# Patient Record
Sex: Female | Born: 1995 | Race: White | Hispanic: No | Marital: Married | Smoking: Current every day smoker
Health system: Southern US, Community
[De-identification: ages and names within clinical notes are randomized; demographics above are authoritative.]

## PROBLEM LIST (undated history)

## (undated) ENCOUNTER — Emergency Department (HOSPITAL_COMMUNITY): Discharge status: Absent without leave | Payer: Self-pay

## (undated) ENCOUNTER — Ambulatory Visit (HOSPITAL_COMMUNITY): Admission: EM | Discharge status: Absent without leave | Payer: No Typology Code available for payment source

## (undated) VITALS — BP 94/64 | HR 86 | Temp 98.6°F | Resp 16 | Ht 67.0 in | Wt 119.0 lb

## (undated) DIAGNOSIS — N390 Urinary tract infection, site not specified: Secondary | ICD-10-CM

## (undated) DIAGNOSIS — J45909 Unspecified asthma, uncomplicated: Secondary | ICD-10-CM

---
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
## 2016-06-30 ENCOUNTER — Emergency Department (HOSPITAL_COMMUNITY): Payer: No Typology Code available for payment source

## 2016-06-30 ENCOUNTER — Encounter (HOSPITAL_COMMUNITY): Payer: Self-pay | Admitting: Emergency Medicine

## 2016-06-30 ENCOUNTER — Ambulatory Visit (HOSPITAL_COMMUNITY)
Admission: EM | Admit: 2016-06-30 | Discharge: 2016-06-30 | Disposition: A | Discharge status: Home / Self Care | Payer: Self-pay | Attending: Family Medicine | Admitting: Family Medicine

## 2016-06-30 ENCOUNTER — Emergency Department (HOSPITAL_COMMUNITY)
Admission: EM | Admit: 2016-06-30 | Discharge: 2016-06-30 | Disposition: A | Discharge status: Home / Self Care | Payer: No Typology Code available for payment source | Attending: Emergency Medicine | Admitting: Emergency Medicine

## 2016-06-30 ENCOUNTER — Ambulatory Visit: Payer: Self-pay

## 2016-06-30 ENCOUNTER — Encounter (HOSPITAL_COMMUNITY): Payer: Self-pay

## 2016-06-30 DIAGNOSIS — Y92410 Unspecified street and highway as the place of occurrence of the external cause: Secondary | ICD-10-CM | POA: Diagnosis not present

## 2016-06-30 DIAGNOSIS — M7918 Myalgia, other site: Secondary | ICD-10-CM

## 2016-06-30 DIAGNOSIS — F17200 Nicotine dependence, unspecified, uncomplicated: Secondary | ICD-10-CM | POA: Insufficient documentation

## 2016-06-30 DIAGNOSIS — S0083XA Contusion of other part of head, initial encounter: Secondary | ICD-10-CM | POA: Insufficient documentation

## 2016-06-30 DIAGNOSIS — M791 Myalgia: Secondary | ICD-10-CM

## 2016-06-30 DIAGNOSIS — Y999 Unspecified external cause status: Secondary | ICD-10-CM | POA: Diagnosis not present

## 2016-06-30 DIAGNOSIS — S161XXA Strain of muscle, fascia and tendon at neck level, initial encounter: Secondary | ICD-10-CM

## 2016-06-30 DIAGNOSIS — S0990XA Unspecified injury of head, initial encounter: Secondary | ICD-10-CM | POA: Diagnosis present

## 2016-06-30 DIAGNOSIS — S060X0A Concussion without loss of consciousness, initial encounter: Secondary | ICD-10-CM

## 2016-06-30 DIAGNOSIS — Y939 Activity, unspecified: Secondary | ICD-10-CM | POA: Diagnosis not present

## 2016-06-30 DIAGNOSIS — M542 Cervicalgia: Secondary | ICD-10-CM | POA: Insufficient documentation

## 2016-06-30 DIAGNOSIS — J45909 Unspecified asthma, uncomplicated: Secondary | ICD-10-CM | POA: Insufficient documentation

## 2016-06-30 HISTORY — DX: Urinary tract infection, site not specified: N39.0

## 2016-06-30 MED ORDER — METHOCARBAMOL 500 MG PO TABS: 500.0000 mg | ORAL_TABLET | Freq: Two times a day (BID) | ORAL | 0 refills | Status: DC

## 2016-06-30 MED ORDER — ACETAMINOPHEN 325 MG PO TABS
650.0000 mg | ORAL_TABLET | Freq: Once | ORAL | Status: AC
  Administered 2016-06-30: 650 mg via ORAL
  Filled 2016-06-30: qty 2
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
## 2016-06-30 NOTE — Discharge Instructions (Signed)
Read the information below.  Your scans were re-assuring. You will probably feel sore for the next 2-3 days. You can take tylenol 650mg  every 6hrs or motrin 400mg  every 6hrs for pain relief.  I have prescribed a muscle relaxer. This can make you drowsy, do not drive after taking.  Use the prescribed medication as directed.  Please discuss all new medications with your pharmacist.   You may return to the Emergency Department at any time for worsening condition or any new symptoms that concern you.
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
## 2016-09-18 ENCOUNTER — Inpatient Hospital Stay (HOSPITAL_COMMUNITY)
Admission: AD | Admit: 2016-09-18 | Discharge: 2016-09-18 | Disposition: A | Discharge status: Home / Self Care | Payer: Self-pay | Source: Ambulatory Visit | Attending: Family Medicine | Admitting: Family Medicine

## 2016-09-18 DIAGNOSIS — N3091 Cystitis, unspecified with hematuria: Secondary | ICD-10-CM | POA: Insufficient documentation

## 2016-09-18 DIAGNOSIS — Z3202 Encounter for pregnancy test, result negative: Secondary | ICD-10-CM | POA: Insufficient documentation

## 2016-09-18 DIAGNOSIS — N3001 Acute cystitis with hematuria: Secondary | ICD-10-CM

## 2016-09-18 DIAGNOSIS — F17200 Nicotine dependence, unspecified, uncomplicated: Secondary | ICD-10-CM | POA: Insufficient documentation

## 2016-09-18 LAB — URINALYSIS, ROUTINE W REFLEX MICROSCOPIC
Bilirubin Urine: NEGATIVE
Glucose, UA: NEGATIVE mg/dL
KETONES UR: NEGATIVE mg/dL
Nitrite: NEGATIVE
PH: 6 (ref 5.0–8.0)
PROTEIN: 30 mg/dL — AB
Specific Gravity, Urine: 1.02 (ref 1.005–1.030)

## 2016-09-18 LAB — POCT PREGNANCY, URINE: Preg Test, Ur: NEGATIVE

## 2016-09-18 MED ORDER — SULFAMETHOXAZOLE-TRIMETHOPRIM 800-160 MG PO TABS: 1.0000 | ORAL_TABLET | Freq: Two times a day (BID) | ORAL | 1 refills | Status: DC

## 2016-09-18 NOTE — Discharge Instructions (Signed)
Urinary Tract Infection, Adult Introduction A urinary tract infection (UTI) is an infection of any part of the urinary tract. The urinary tract includes the:  Kidneys.  Ureters.  Bladder.  Urethra. These organs make, store, and get rid of pee (urine) in the body. Follow these instructions at home:  Take over-the-counter and prescription medicines only as told by your doctor.  If you were prescribed an antibiotic medicine, take it as told by your doctor. Do not stop taking the antibiotic even if you start to feel better.  Avoid the following drinks:  Alcohol.  Caffeine.  Tea.  Carbonated drinks.  Drink enough fluid to keep your pee clear or pale yellow.  Keep all follow-up visits as told by your doctor. This is important.  Make sure to:  Empty your bladder often and completely. Do not to hold pee for long periods of time.  Empty your bladder before and after sex.  Wipe from front to back after a bowel movement if you are female. Use each tissue one time when you wipe. Contact a doctor if:  You have back pain.  You have a fever.  You feel sick to your stomach (nauseous).  You throw up (vomit).  Your symptoms do not get better after 3 days.  Your symptoms go away and then come back. Get help right away if:  You have very bad back pain.  You have very bad lower belly (abdominal) pain.  You are throwing up and cannot keep down any medicines or water. This information is not intended to replace advice given to you by your health care provider. Make sure you discuss any questions you have with your health care provider. Document Released: 03/16/2008 Document Revised: 03/05/2016 Document Reviewed: 08/19/2015  2017 Elsevier  

## 2016-09-18 NOTE — MAU Note (Signed)
Patient has blood in urine, frequency and dysuria started 2 night ago.

## 2016-09-18 NOTE — MAU Provider Note (Signed)
Chief Complaint:  Dysuria   First Provider Initiated Contact with Patient 09/18/16 1528       HPI: Vanessa MortonCarolyn Henry is a 20 y.o. No obstetric history on file. who presents to maternity admissions reporting blood in urine. Has some frequency and dysuria.. She reports no vaginal bleeding, vaginal itching/burning, urinary symptoms, h/a, dizziness, n/v, or fever/chills.    Dysuria   This is a new problem. The current episode started in the past 7 days. The problem occurs every urination. The problem has been unchanged. The quality of the pain is described as burning. The pain is mild. There has been no fever. There is a history of pyelonephritis. Associated symptoms include hematuria. Pertinent negatives include no chills, flank pain, nausea, urgency or vomiting. She has tried nothing for the symptoms.   RN Note: Patient has blood in urine, frequency and dysuria started 2 night ago.    Past Medical History: Past Medical History:  Diagnosis Date  . Asthma   . UTI (lower urinary tract infection)     Past obstetric history: OB History  No data available    Past Surgical History: No past surgical history on file.  Family History: No family history on file.  Social History: Social History  Substance Use Topics  . Smoking status: Current Every Day Smoker  . Smokeless tobacco: Never Used  . Alcohol use Yes    Allergies:  Allergies  Allergen Reactions  . Red Dye Hives    Meds:  Prescriptions Prior to Admission  Medication Sig Dispense Refill Last Dose  . ibuprofen (ADVIL,MOTRIN) 200 MG tablet Take 400 mg by mouth every 6 (six) hours as needed (for pain).   Past Week at Unknown time  . methocarbamol (ROBAXIN) 500 MG tablet Take 1 tablet (500 mg total) by mouth 2 (two) times daily. 10 tablet 0   . nitrofurantoin, macrocrystal-monohydrate, (MACROBID) 100 MG capsule Take 1 capsule (100 mg total) by mouth 2 (two) times daily. (Patient not taking: Reported on 06/30/2016) 14 capsule 0  Not Taking at Unknown time    I have reviewed patient's Past Medical Hx, Surgical Hx, Family Hx, Social Hx, medications and allergies.  ROS:  Review of Systems  Constitutional: Negative for chills.  Gastrointestinal: Negative for nausea and vomiting.  Genitourinary: Positive for dysuria and hematuria. Negative for flank pain and urgency.   Other systems negative     Physical Exam  Patient Vitals for the past 24 hrs:  BP Temp Pulse Resp Height Weight  09/18/16 1437 125/65 98.9 F (37.2 C) 75 18 5\' 7"  (1.702 m) 115 lb (52.2 kg)   Constitutional: Well-developed, well-nourished female in no acute distress.  Cardiovascular: normal rate and rhythm, no ectopy audible, S1 & S2 heard, no murmur Respiratory: normal effort, no distress. Lungs CTAB with no wheezes or crackles GI: Abd soft, non-tender.  Nondistended.  No rebound, No guarding.  Bowel Sounds audible  MS: Extremities nontender, no edema, normal ROM Neurologic: Alert and oriented x 4.   Grossly nonfocal. GU: Neg CVAT. Skin:  Warm and Dry Psych:  Affect appropriate.  PELVIC EXAM: deferred   Labs: Results for orders placed or performed during the hospital encounter of 09/18/16 (from the past 24 hour(s))  Urinalysis, Routine w reflex microscopic     Status: Abnormal   Collection Time: 09/18/16  2:39 PM  Result Value Ref Range   Color, Urine YELLOW YELLOW   APPearance HAZY (A) CLEAR   Specific Gravity, Urine 1.020 1.005 - 1.030   pH  6.0 5.0 - 8.0   Glucose, UA NEGATIVE NEGATIVE mg/dL   Hgb urine dipstick SMALL (A) NEGATIVE   Bilirubin Urine NEGATIVE NEGATIVE   Ketones, ur NEGATIVE NEGATIVE mg/dL   Protein, ur 30 (A) NEGATIVE mg/dL   Nitrite NEGATIVE NEGATIVE   Leukocytes, UA MODERATE (A) NEGATIVE   RBC / HPF TOO NUMEROUS TO COUNT 0 - 5 RBC/hpf   WBC, UA TOO NUMEROUS TO COUNT 0 - 5 WBC/hpf   Bacteria, UA RARE (A) NONE SEEN   Squamous Epithelial / LPF 0-5 (A) NONE SEEN   Mucous PRESENT   Pregnancy, urine POC      Status: None   Collection Time: 09/18/16  2:47 PM  Result Value Ref Range   Preg Test, Ur NEGATIVE NEGATIVE      Imaging:  No results found.  MAU Course/MDM: I have ordered labs as follows: see above, added culture Imaging ordered: none Results reviewed.   Pt stable at time of discharge.  Assessment: Cystitis with hematuria  Plan: Discharge home Recommend Push fluids Rx sent for Bactrim Ds for UTI   Encouraged to return here or to other Urgent Care/ED if she develops worsening of symptoms, increase in pain, fever, or other concerning symptoms.   Wynelle BourgeoisMarie Deangela Randleman CNM, MSN Certified Nurse-Midwife 09/18/2016 3:28 PM

## 2016-09-20 LAB — URINE CULTURE: Culture: >=100000 — AB

## 2018-07-18 DIAGNOSIS — N760 Acute vaginitis: Secondary | ICD-10-CM | POA: Diagnosis not present

## 2018-07-18 DIAGNOSIS — R3915 Urgency of urination: Secondary | ICD-10-CM | POA: Diagnosis not present

## 2018-12-20 DIAGNOSIS — F411 Generalized anxiety disorder: Secondary | ICD-10-CM | POA: Diagnosis not present

## 2018-12-20 DIAGNOSIS — F331 Major depressive disorder, recurrent, moderate: Secondary | ICD-10-CM | POA: Diagnosis not present

## 2018-12-22 DIAGNOSIS — J069 Acute upper respiratory infection, unspecified: Secondary | ICD-10-CM | POA: Diagnosis not present

## 2018-12-22 DIAGNOSIS — J309 Allergic rhinitis, unspecified: Secondary | ICD-10-CM | POA: Diagnosis not present

## 2018-12-23 DIAGNOSIS — F411 Generalized anxiety disorder: Secondary | ICD-10-CM | POA: Diagnosis not present

## 2018-12-23 DIAGNOSIS — F331 Major depressive disorder, recurrent, moderate: Secondary | ICD-10-CM | POA: Diagnosis not present

## 2019-01-17 DIAGNOSIS — F331 Major depressive disorder, recurrent, moderate: Secondary | ICD-10-CM | POA: Diagnosis not present

## 2019-01-20 DIAGNOSIS — F331 Major depressive disorder, recurrent, moderate: Secondary | ICD-10-CM | POA: Diagnosis not present

## 2019-02-21 DIAGNOSIS — F331 Major depressive disorder, recurrent, moderate: Secondary | ICD-10-CM | POA: Diagnosis not present

## 2019-02-21 DIAGNOSIS — F411 Generalized anxiety disorder: Secondary | ICD-10-CM | POA: Diagnosis not present

## 2019-05-17 DIAGNOSIS — R5383 Other fatigue: Secondary | ICD-10-CM | POA: Diagnosis not present

## 2019-05-17 DIAGNOSIS — Z7189 Other specified counseling: Secondary | ICD-10-CM | POA: Diagnosis not present

## 2019-05-17 DIAGNOSIS — K219 Gastro-esophageal reflux disease without esophagitis: Secondary | ICD-10-CM | POA: Diagnosis not present

## 2019-06-05 DIAGNOSIS — Z20828 Contact with and (suspected) exposure to other viral communicable diseases: Secondary | ICD-10-CM | POA: Diagnosis not present

## 2019-07-06 ENCOUNTER — Other Ambulatory Visit: Payer: Self-pay

## 2019-07-06 DIAGNOSIS — Z20822 Contact with and (suspected) exposure to covid-19: Secondary | ICD-10-CM

## 2019-07-07 LAB — NOVEL CORONAVIRUS, NAA: SARS-CoV-2, NAA: NOT DETECTED

## 2019-08-02 ENCOUNTER — Other Ambulatory Visit: Payer: Self-pay

## 2019-08-02 DIAGNOSIS — Z20822 Contact with and (suspected) exposure to covid-19: Secondary | ICD-10-CM

## 2019-08-03 LAB — NOVEL CORONAVIRUS, NAA: SARS-CoV-2, NAA: NOT DETECTED

## 2019-08-07 ENCOUNTER — Other Ambulatory Visit: Payer: Self-pay | Admitting: Registered"

## 2019-08-07 DIAGNOSIS — Z20822 Contact with and (suspected) exposure to covid-19: Secondary | ICD-10-CM

## 2019-08-08 LAB — NOVEL CORONAVIRUS, NAA: SARS-CoV-2, NAA: NOT DETECTED

## 2019-08-21 ENCOUNTER — Other Ambulatory Visit: Payer: Self-pay

## 2019-08-21 DIAGNOSIS — Z20822 Contact with and (suspected) exposure to covid-19: Secondary | ICD-10-CM

## 2019-08-22 LAB — NOVEL CORONAVIRUS, NAA: SARS-CoV-2, NAA: NOT DETECTED

## 2019-09-06 ENCOUNTER — Other Ambulatory Visit: Payer: Self-pay

## 2019-09-06 DIAGNOSIS — Z20822 Contact with and (suspected) exposure to covid-19: Secondary | ICD-10-CM

## 2019-09-08 LAB — NOVEL CORONAVIRUS, NAA: SARS-CoV-2, NAA: NOT DETECTED

## 2019-09-12 ENCOUNTER — Other Ambulatory Visit: Payer: Self-pay

## 2019-09-12 DIAGNOSIS — Z20822 Contact with and (suspected) exposure to covid-19: Secondary | ICD-10-CM

## 2019-09-14 LAB — NOVEL CORONAVIRUS, NAA: SARS-CoV-2, NAA: NOT DETECTED

## 2019-09-29 ENCOUNTER — Other Ambulatory Visit: Payer: Self-pay | Admitting: Cardiology

## 2019-09-29 DIAGNOSIS — Z20822 Contact with and (suspected) exposure to covid-19: Secondary | ICD-10-CM

## 2019-10-01 LAB — NOVEL CORONAVIRUS, NAA: SARS-CoV-2, NAA: NOT DETECTED

## 2019-10-20 ENCOUNTER — Ambulatory Visit: Payer: Medicaid Other | Attending: Internal Medicine

## 2019-10-20 DIAGNOSIS — Z20822 Contact with and (suspected) exposure to covid-19: Secondary | ICD-10-CM

## 2019-10-22 LAB — NOVEL CORONAVIRUS, NAA: SARS-CoV-2, NAA: NOT DETECTED

## 2019-10-31 ENCOUNTER — Ambulatory Visit: Payer: Medicaid Other | Attending: Internal Medicine

## 2019-10-31 DIAGNOSIS — Z20822 Contact with and (suspected) exposure to covid-19: Secondary | ICD-10-CM

## 2019-11-01 LAB — NOVEL CORONAVIRUS, NAA: SARS-CoV-2, NAA: NOT DETECTED

## 2019-11-14 ENCOUNTER — Ambulatory Visit: Payer: Medicaid Other | Attending: Internal Medicine

## 2019-11-14 DIAGNOSIS — Z20822 Contact with and (suspected) exposure to covid-19: Secondary | ICD-10-CM

## 2019-11-15 LAB — NOVEL CORONAVIRUS, NAA: SARS-CoV-2, NAA: NOT DETECTED

## 2019-11-29 ENCOUNTER — Ambulatory Visit: Payer: Medicaid Other | Attending: Internal Medicine

## 2019-11-29 DIAGNOSIS — Z20822 Contact with and (suspected) exposure to covid-19: Secondary | ICD-10-CM

## 2019-11-30 LAB — NOVEL CORONAVIRUS, NAA: SARS-CoV-2, NAA: NOT DETECTED

## 2019-12-13 ENCOUNTER — Ambulatory Visit: Payer: Medicaid Other | Attending: Internal Medicine

## 2019-12-13 DIAGNOSIS — Z20822 Contact with and (suspected) exposure to covid-19: Secondary | ICD-10-CM

## 2019-12-14 LAB — NOVEL CORONAVIRUS, NAA: SARS-CoV-2, NAA: NOT DETECTED

## 2019-12-20 ENCOUNTER — Ambulatory Visit: Payer: Medicaid Other | Attending: Internal Medicine

## 2019-12-20 DIAGNOSIS — Z20822 Contact with and (suspected) exposure to covid-19: Secondary | ICD-10-CM

## 2019-12-21 LAB — NOVEL CORONAVIRUS, NAA: SARS-CoV-2, NAA: NOT DETECTED

## 2020-01-17 ENCOUNTER — Ambulatory Visit: Payer: Medicaid Other | Attending: Internal Medicine

## 2020-01-17 DIAGNOSIS — Z20822 Contact with and (suspected) exposure to covid-19: Secondary | ICD-10-CM

## 2020-01-18 LAB — NOVEL CORONAVIRUS, NAA: SARS-CoV-2, NAA: NOT DETECTED

## 2020-01-18 LAB — SARS-COV-2, NAA 2 DAY TAT

## 2020-03-06 ENCOUNTER — Ambulatory Visit: Payer: Medicaid Other | Attending: Internal Medicine

## 2020-03-06 DIAGNOSIS — Z20822 Contact with and (suspected) exposure to covid-19: Secondary | ICD-10-CM

## 2020-03-07 LAB — NOVEL CORONAVIRUS, NAA: SARS-CoV-2, NAA: NOT DETECTED

## 2020-03-07 LAB — SARS-COV-2, NAA 2 DAY TAT

## 2020-04-01 ENCOUNTER — Ambulatory Visit: Payer: Medicaid Other | Attending: Internal Medicine

## 2020-04-01 DIAGNOSIS — Z20822 Contact with and (suspected) exposure to covid-19: Secondary | ICD-10-CM

## 2020-04-02 LAB — NOVEL CORONAVIRUS, NAA: SARS-CoV-2, NAA: NOT DETECTED

## 2020-04-02 LAB — SARS-COV-2, NAA 2 DAY TAT

## 2020-05-01 ENCOUNTER — Other Ambulatory Visit: Payer: Self-pay | Admitting: *Deleted

## 2020-05-01 ENCOUNTER — Ambulatory Visit: Payer: Medicaid Other | Attending: Internal Medicine

## 2020-05-01 DIAGNOSIS — Z20822 Contact with and (suspected) exposure to covid-19: Secondary | ICD-10-CM

## 2020-05-02 LAB — NOVEL CORONAVIRUS, NAA: SARS-CoV-2, NAA: NOT DETECTED

## 2020-05-02 LAB — SARS-COV-2, NAA 2 DAY TAT

## 2020-05-14 ENCOUNTER — Other Ambulatory Visit: Payer: Medicaid Other

## 2020-05-14 ENCOUNTER — Other Ambulatory Visit: Payer: Self-pay

## 2020-05-14 DIAGNOSIS — Z20822 Contact with and (suspected) exposure to covid-19: Secondary | ICD-10-CM

## 2020-05-15 LAB — NOVEL CORONAVIRUS, NAA: SARS-CoV-2, NAA: NOT DETECTED

## 2020-05-15 LAB — SARS-COV-2, NAA 2 DAY TAT

## 2020-06-13 ENCOUNTER — Other Ambulatory Visit: Payer: Self-pay

## 2020-06-13 ENCOUNTER — Other Ambulatory Visit: Payer: Self-pay | Admitting: Behavioral Health

## 2020-06-13 ENCOUNTER — Inpatient Hospital Stay (HOSPITAL_COMMUNITY)
Admission: RE | Admit: 2020-06-13 | Discharge: 2020-06-16 | DRG: 881 | Disposition: A | Discharge status: Home / Self Care | Payer: Federal, State, Local not specified - Other | Attending: Psychiatry | Admitting: Psychiatry

## 2020-06-13 ENCOUNTER — Encounter (HOSPITAL_COMMUNITY): Payer: Self-pay | Admitting: Behavioral Health

## 2020-06-13 DIAGNOSIS — Z79899 Other long term (current) drug therapy: Secondary | ICD-10-CM

## 2020-06-13 DIAGNOSIS — F17200 Nicotine dependence, unspecified, uncomplicated: Secondary | ICD-10-CM | POA: Diagnosis present

## 2020-06-13 DIAGNOSIS — E739 Lactose intolerance, unspecified: Secondary | ICD-10-CM | POA: Diagnosis present

## 2020-06-13 DIAGNOSIS — Z9109 Other allergy status, other than to drugs and biological substances: Secondary | ICD-10-CM

## 2020-06-13 DIAGNOSIS — R45851 Suicidal ideations: Secondary | ICD-10-CM | POA: Diagnosis present

## 2020-06-13 DIAGNOSIS — Z20822 Contact with and (suspected) exposure to covid-19: Secondary | ICD-10-CM | POA: Diagnosis present

## 2020-06-13 DIAGNOSIS — J45909 Unspecified asthma, uncomplicated: Secondary | ICD-10-CM | POA: Diagnosis present

## 2020-06-13 DIAGNOSIS — Z9102 Food additives allergy status: Secondary | ICD-10-CM

## 2020-06-13 DIAGNOSIS — F322 Major depressive disorder, single episode, severe without psychotic features: Secondary | ICD-10-CM | POA: Diagnosis not present

## 2020-06-13 DIAGNOSIS — F4310 Post-traumatic stress disorder, unspecified: Secondary | ICD-10-CM

## 2020-06-13 DIAGNOSIS — F411 Generalized anxiety disorder: Secondary | ICD-10-CM

## 2020-06-13 DIAGNOSIS — Z62810 Personal history of physical and sexual abuse in childhood: Secondary | ICD-10-CM | POA: Diagnosis present

## 2020-06-13 DIAGNOSIS — F329 Major depressive disorder, single episode, unspecified: Principal | ICD-10-CM | POA: Diagnosis present

## 2020-06-13 DIAGNOSIS — G479 Sleep disorder, unspecified: Secondary | ICD-10-CM | POA: Diagnosis present

## 2020-06-13 DIAGNOSIS — Z818 Family history of other mental and behavioral disorders: Secondary | ICD-10-CM

## 2020-06-13 DIAGNOSIS — Z915 Personal history of self-harm: Secondary | ICD-10-CM

## 2020-06-13 DIAGNOSIS — Z62811 Personal history of psychological abuse in childhood: Secondary | ICD-10-CM | POA: Diagnosis present

## 2020-06-13 DIAGNOSIS — F332 Major depressive disorder, recurrent severe without psychotic features: Secondary | ICD-10-CM

## 2020-06-13 LAB — SARS CORONAVIRUS 2 BY RT PCR (HOSPITAL ORDER, PERFORMED IN ~~LOC~~ HOSPITAL LAB): SARS Coronavirus 2: NEGATIVE

## 2020-06-13 MED ORDER — FAMOTIDINE 20 MG PO TABS
20.0000 mg | ORAL_TABLET | Freq: Every day | ORAL | Status: DC
  Filled 2020-06-13 (×2): qty 1

## 2020-06-13 MED ORDER — BUSPIRONE HCL 5 MG PO TABS
5.0000 mg | ORAL_TABLET | Freq: Two times a day (BID) | ORAL | Status: DC
  Filled 2020-06-13 (×2): qty 1

## 2020-06-13 MED ORDER — HYDROXYZINE HCL 25 MG PO TABS
25.0000 mg | ORAL_TABLET | Freq: Three times a day (TID) | ORAL | Status: DC | PRN
  Administered 2020-06-15: 25 mg via ORAL
  Filled 2020-06-13: qty 1
  Filled 2020-06-13: qty 10

## 2020-06-13 MED ORDER — NICOTINE POLACRILEX 2 MG MT GUM
2.0000 mg | CHEWING_GUM | OROMUCOSAL | Status: DC | PRN
  Administered 2020-06-13 – 2020-06-16 (×10): 2 mg via ORAL
  Filled 2020-06-13: qty 1

## 2020-06-13 MED ORDER — TRAZODONE HCL 50 MG PO TABS
50.0000 mg | ORAL_TABLET | Freq: Every evening | ORAL | Status: DC | PRN
  Administered 2020-06-13: 50 mg via ORAL
  Filled 2020-06-13: qty 7

## 2020-06-13 MED ORDER — PRAZOSIN HCL 1 MG PO CAPS
1.0000 mg | ORAL_CAPSULE | Freq: Every day | ORAL | Status: DC
  Administered 2020-06-13: 1 mg via ORAL
  Filled 2020-06-13 (×4): qty 1

## 2020-06-13 MED ORDER — BUSPIRONE HCL 10 MG PO TABS
10.0000 mg | ORAL_TABLET | Freq: Three times a day (TID) | ORAL | Status: DC
  Administered 2020-06-14 – 2020-06-16 (×8): 10 mg via ORAL
  Filled 2020-06-13: qty 2
  Filled 2020-06-13: qty 21
  Filled 2020-06-13 (×2): qty 1
  Filled 2020-06-13: qty 21
  Filled 2020-06-13 (×3): qty 1
  Filled 2020-06-13: qty 2
  Filled 2020-06-13 (×5): qty 1
  Filled 2020-06-13: qty 21

## 2020-06-13 MED ORDER — FAMOTIDINE 20 MG PO TABS
20.0000 mg | ORAL_TABLET | Freq: Two times a day (BID) | ORAL | Status: DC
  Administered 2020-06-14 – 2020-06-16 (×5): 20 mg via ORAL
  Filled 2020-06-13 (×3): qty 1
  Filled 2020-06-13: qty 14
  Filled 2020-06-13 (×4): qty 1
  Filled 2020-06-13: qty 14
  Filled 2020-06-13: qty 1

## 2020-06-13 MED ORDER — ACETAMINOPHEN 325 MG PO TABS
650.0000 mg | ORAL_TABLET | Freq: Four times a day (QID) | ORAL | Status: DC | PRN
  Administered 2020-06-14: 650 mg via ORAL
  Filled 2020-06-13: qty 2

## 2020-06-13 MED ORDER — ALUM & MAG HYDROXIDE-SIMETH 200-200-20 MG/5ML PO SUSP
30.0000 mL | Freq: Four times a day (QID) | ORAL | Status: DC | PRN
  Administered 2020-06-14: 30 mL via ORAL
  Filled 2020-06-13 (×2): qty 30

## 2020-06-13 MED ORDER — ESCITALOPRAM OXALATE 5 MG PO TABS
15.0000 mg | ORAL_TABLET | Freq: Every day | ORAL | Status: DC
  Filled 2020-06-13: qty 1

## 2020-06-13 MED ORDER — ESCITALOPRAM OXALATE 20 MG PO TABS
20.0000 mg | ORAL_TABLET | Freq: Every day | ORAL | Status: DC
  Administered 2020-06-13 – 2020-06-15 (×3): 20 mg via ORAL
  Filled 2020-06-13: qty 2
  Filled 2020-06-13 (×3): qty 1
  Filled 2020-06-13: qty 2
  Filled 2020-06-13: qty 7
  Filled 2020-06-13: qty 1

## 2020-06-13 MED ORDER — MAGNESIUM HYDROXIDE 400 MG/5ML PO SUSP: 30.0000 mL | Freq: Every day | ORAL | Status: DC | PRN
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
## 2020-06-13 NOTE — Tx Team (Signed)
Initial Treatment Plan 06/13/2020 5:27 PM Bartholome Bill. Erber TXM:[id number]    PATIENT STRESSORS: Financial difficulties Health problems Marital or family conflict Occupational concerns   PATIENT STRENGTHS: Ability for insight Capable of independent living Licensed conveyancer Motivation for treatment/growth Physical Health   PATIENT IDENTIFIED PROBLEMS: anxiety  depression  Possible ADD/ADHD  Financial                DISCHARGE CRITERIA:  Ability to meet basic life and health needs Adequate post-discharge living arrangements Improved stabilization in mood, thinking, and/or behavior Motivation to continue treatment in a less acute level of care  PRELIMINARY DISCHARGE PLAN: Attend aftercare/continuing care group Outpatient therapy Return to previous living arrangement  PATIENT/FAMILY INVOLVEMENT: This treatment plan has been presented to and reviewed with the patient, Vanessa Henry. Bellisario .  The patient has been given the opportunity to ask questions and make suggestions.  Wardell Heath, RN 06/13/2020, 5:27 PM

## 2020-06-13 NOTE — Progress Notes (Signed)
Patient rated her day as a 3 out of a possible 10 since she had a bad start to her day. Her goal for tomorrow is to find out more about the schedule and to find time to talk to her sisters.
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
## 2020-06-14 DIAGNOSIS — F4310 Post-traumatic stress disorder, unspecified: Secondary | ICD-10-CM | POA: Diagnosis present

## 2020-06-14 LAB — COMPREHENSIVE METABOLIC PANEL
ALT: 16 U/L (ref 0–44)
AST: 19 U/L (ref 15–41)
Albumin: 4.6 g/dL (ref 3.5–5.0)
Alkaline Phosphatase: 49 U/L (ref 38–126)
Anion gap: 10 (ref 5–15)
BUN: 13 mg/dL (ref 6–20)
CO2: 24 mmol/L (ref 22–32)
Calcium: 9.5 mg/dL (ref 8.9–10.3)
Chloride: 106 mmol/L (ref 98–111)
Creatinine, Ser: 0.64 mg/dL (ref 0.44–1.00)
GFR calc Af Amer: >60 mL/min (ref >60)
GFR calc non Af Amer: >60 mL/min (ref >60)
Glucose, Bld: 115 mg/dL — ABNORMAL HIGH (ref 70–99)
Potassium: 3.7 mmol/L (ref 3.5–5.1)
Sodium: 140 mmol/L (ref 135–145)
Total Bilirubin: 0.5 mg/dL (ref 0.3–1.2)
Total Protein: 7.3 g/dL (ref 6.5–8.1)

## 2020-06-14 LAB — LIPID PANEL
Cholesterol: 140 mg/dL (ref 0–200)
HDL: 58 mg/dL (ref >40)
LDL Cholesterol: 71 mg/dL (ref 0–99)
Total CHOL/HDL Ratio: 2.4 RATIO
Triglycerides: 55 mg/dL (ref <150)
VLDL: 11 mg/dL (ref 0–40)

## 2020-06-14 LAB — RAPID URINE DRUG SCREEN, HOSP PERFORMED
Amphetamines: NOT DETECTED
Barbiturates: NOT DETECTED
Benzodiazepines: NOT DETECTED
Cocaine: NOT DETECTED
Opiates: NOT DETECTED
Tetrahydrocannabinol: NOT DETECTED

## 2020-06-14 LAB — TSH: TSH: 1.373 u[IU]/mL (ref 0.350–4.500)

## 2020-06-14 LAB — CBC
HCT: 37.8 % (ref 36.0–46.0)
Hemoglobin: 13.2 g/dL (ref 12.0–15.0)
MCH: 32.9 pg (ref 26.0–34.0)
MCHC: 34.9 g/dL (ref 30.0–36.0)
MCV: 94.3 fL (ref 80.0–100.0)
Platelets: 241 10*3/uL (ref 150–400)
RBC: 4.01 MIL/uL (ref 3.87–5.11)
RDW: 11.7 % (ref 11.5–15.5)
WBC: 5.9 10*3/uL (ref 4.0–10.5)
nRBC: 0 % (ref 0.0–0.2)

## 2020-06-14 LAB — ETHANOL: Alcohol, Ethyl (B): <10 mg/dL (ref <10)

## 2020-06-14 LAB — PREGNANCY, URINE: Preg Test, Ur: NEGATIVE

## 2020-06-14 LAB — HEMOGLOBIN A1C
Hgb A1c MFr Bld: 4.8 % (ref 4.8–5.6)
Mean Plasma Glucose: 91.06 mg/dL

## 2020-06-14 MED ORDER — ENSURE ENLIVE PO LIQD
237.0000 mL | Freq: Two times a day (BID) | ORAL | Status: DC
  Administered 2020-06-14 – 2020-06-16 (×6): 237 mL via ORAL

## 2020-06-14 NOTE — Progress Notes (Signed)
Patient denies SI, HI an AVH this shift.  Patient has been visible on the unit and engaged in groups and unit activities.  Patient denies SI, HI and AVH this shift. Continue to monitor as planned.

## 2020-06-14 NOTE — Progress Notes (Signed)
Recreation Therapy Notes ° °Date: 9.3.21 °Time: 0930 °Location: [address] Dayroom ° °Group Topic: Stress Management ° °Goal Area(s) Addresses:  °Patient will identify positive stress management techniques. °Patient will identify benefits of using stress management post d/c. ° °Behavioral Response: Engaged ° °Intervention: Stress Management ° °Activity:  Guided Imagery.  LRT read Henry script that focused on relaxing in [place] peaceful [place].  Patients were to listen and follow along as script was read to fully engage.   ° °Education:  Stress Management, Discharge Planning.  ° °Education Outcome: Acknowledges Education ° °Clinical Observations/Feedback: Pt attended and participated in activity. ° ° ° °Vanessa Henry, Vanessa Henry ° ° ° ° ° ° ° ° °Vanessa Henry °06/14/2020 11:42 AM °

## 2020-06-14 NOTE — Progress Notes (Signed)
Patient did attend the evening speaker AA meeting.  

## 2020-06-14 NOTE — Progress Notes (Addendum)
   06/13/20 2120  Psych Admission Type (Psych Patients Only)  Admission Status Voluntary  Psychosocial Assessment  Patient Complaints Anxiety  Eye Contact Fair  Facial Expression Anxious  Affect Depressed;Sad;Anxious  Speech Logical/coherent  Interaction Assertive  Motor Activity Other (Comment) (wnl)  Appearance/Hygiene Unremarkable  Behavior Characteristics Cooperative;Anxious  Mood Pleasant;Anxious  Thought Process  Coherency WDL  Content WDL  Delusions None reported or observed  Perception WDL  Hallucination None reported or observed  Judgment Poor  Confusion None  Danger to Self  Current suicidal ideation? Denies  Danger to Others  Danger to Others None reported or observed   Pt rates anxiety 10/10. States her anxiety is always high. She has a lot of stress and she wants to learn how to cope with it. Pt also states that her "vices" that she uses to deal with stress are not working. Some examples include: vaping and playing games on her phone to avoid the stressful situations in her life. Pt didn't want to come at first, because her feelings subsided, but decided she needed help. "If my 86 year old sister can come to a place like this, so can I. I helped her through her time getting help." Pt has also been diagnosed with C-PTSD.   Pt states that she is not actually allergic to red dye. Her mother said that after she developed hives as a child one Easter when they were dyeing eggs.

## 2020-06-14 NOTE — Tx Team (Signed)
Interdisciplinary Treatment and Diagnostic Plan Update  06/14/2020 Time of Session: 10:10am Vanessa Henry. [place] MRN: [id number]  Principal Diagnosis: <principal problem not specified>  Secondary Diagnoses: Active Problems:   MDD (major depressive disorder)   Current Medications:  Current Facility-Administered Medications  Medication Dose Route Frequency Provider Last Rate Last Admin  . acetaminophen (TYLENOL) tablet 650 mg  650 mg Oral Q6H PRN Dagar, Kannen Moxey Staggers, MD   650 mg at 06/14/20 1233  . alum & mag hydroxide-simeth (MAALOX/MYLANTA) 200-200-20 MG/5ML suspension 30 mL  30 mL Oral Q6H PRN Dagar, Morenike Cuff Staggers, MD      . busPIRone (BUSPAR) tablet 10 mg  10 mg Oral TID Sharma Covert, MD   10 mg at 06/14/20 1233  . escitalopram (LEXAPRO) tablet 20 mg  20 mg Oral QHS Sharma Covert, MD   20 mg at 06/13/20 2143  . famotidine (PEPCID) tablet 20 mg  20 mg Oral BID Sharma Covert, MD   20 mg at 06/14/20 [phone]  . feeding supplement (ENSURE ENLIVE) (ENSURE ENLIVE) liquid 237 mL  237 mL Oral BID BM Dagar, Anjali, MD   237 mL at 06/14/20 1232  . hydrOXYzine (ATARAX/VISTARIL) tablet 25 mg  25 mg Oral TID PRN Dagar, Kahiau Schewe Staggers, MD      . magnesium hydroxide (MILK OF MAGNESIA) suspension 30 mL  30 mL Oral Daily PRN Dagar, Beronica Lansdale Staggers, MD      . nicotine polacrilex (NICORETTE) gum 2 mg  2 mg Oral PRN Lindon Romp A, NP   2 mg at 06/14/20 1233  . traZODone (DESYREL) tablet 50 mg  50 mg Oral QHS PRN Dagar, Branae Crail Staggers, MD   50 mg at 06/13/20 2143   PTA Medications: Medications Prior to Admission  Medication Sig Dispense Refill Last Dose  . busPIRone (BUSPAR) 5 MG tablet Take 5 mg by mouth 2 (two) times daily.     Marland Kitchen escitalopram (LEXAPRO) 5 MG tablet Take 15 mg by mouth at bedtime.     . famotidine (PEPCID) 20 MG tablet Take 20 mg by mouth 2 (two) times daily.       Patient Stressors: Financial difficulties Health problems Marital or family conflict Occupational concerns  Patient Strengths: Ability for  insight Capable of independent living Child psychotherapist Motivation for treatment/growth Physical Health  Treatment Modalities: Medication Management, Group therapy, Case management,  1 to 1 session with clinician, Psychoeducation, Recreational therapy.   Physician Treatment Plan for Primary Diagnosis: <principal problem not specified> Long Term Goal(s): Improvement in symptoms so as ready for discharge Improvement in symptoms so as ready for discharge   Short Term Goals: Ability to identify changes in lifestyle to reduce recurrence of condition will improve Ability to verbalize feelings will improve Ability to disclose and discuss suicidal ideas Ability to demonstrate self-control will improve Ability to identify and develop effective coping behaviors will improve Ability to maintain clinical measurements within normal limits will improve Ability to identify changes in lifestyle to reduce recurrence of condition will improve Ability to verbalize feelings will improve Ability to disclose and discuss suicidal ideas Ability to demonstrate self-control will improve Ability to identify and develop effective coping behaviors will improve Ability to maintain clinical measurements within normal limits will improve  Medication Management: Evaluate patient's response, side effects, and tolerance of medication regimen.  Therapeutic Interventions: 1 to 1 sessions, Unit Group sessions and Medication administration.  Evaluation of Outcomes: Not Met  Physician Treatment Plan for Secondary Diagnosis: Active Problems:   MDD (major depressive disorder)  Long  Term Goal(s): Improvement in symptoms so as ready for discharge Improvement in symptoms so as ready for discharge   Short Term Goals: Ability to identify changes in lifestyle to reduce recurrence of condition will improve Ability to verbalize feelings will improve Ability to disclose and discuss suicidal ideas Ability  to demonstrate self-control will improve Ability to identify and develop effective coping behaviors will improve Ability to maintain clinical measurements within normal limits will improve Ability to identify changes in lifestyle to reduce recurrence of condition will improve Ability to verbalize feelings will improve Ability to disclose and discuss suicidal ideas Ability to demonstrate self-control will improve Ability to identify and develop effective coping behaviors will improve Ability to maintain clinical measurements within normal limits will improve     Medication Management: Evaluate patient's response, side effects, and tolerance of medication regimen.  Therapeutic Interventions: 1 to 1 sessions, Unit Group sessions and Medication administration.  Evaluation of Outcomes: Not Met   RN Treatment Plan for Primary Diagnosis: <principal problem not specified> Long Term Goal(s): Knowledge of disease and therapeutic regimen to maintain health will improve  Short Term Goals: Ability to remain free from injury will improve, Ability to verbalize frustration and anger appropriately will improve, Ability to disclose and discuss suicidal ideas, Ability to identify and develop effective coping behaviors will improve and Compliance with prescribed medications will improve  Medication Management: RN will administer medications as ordered by provider, will assess and evaluate patient's response and provide education to patient for prescribed medication. RN will report any adverse and/or side effects to prescribing provider.  Therapeutic Interventions: 1 on 1 counseling sessions, Psychoeducation, Medication administration, Evaluate responses to treatment, Monitor vital signs and CBGs as ordered, Perform/monitor CIWA, COWS, AIMS and Fall Risk screenings as ordered, Perform wound care treatments as ordered.  Evaluation of Outcomes: Not Met   LCSW Treatment Plan for Primary Diagnosis: <principal  problem not specified> Long Term Goal(s): Safe transition to appropriate next level of care at discharge, Engage patient in therapeutic group addressing interpersonal concerns.  Short Term Goals: Engage patient in aftercare planning with referrals and resources, Increase social support, Increase ability to appropriately verbalize feelings, Identify triggers associated with mental health/substance abuse issues and Increase skills for wellness and recovery  Therapeutic Interventions: Assess for all discharge needs, 1 to 1 time with Social worker, Explore available resources and support systems, Assess for adequacy in community support network, Educate family and significant other(s) on suicide prevention, Complete Psychosocial Assessment, Interpersonal group therapy.  Evaluation of Outcomes: Not Met   Progress in Treatment: Attending groups: Yes. Participating in groups: Yes. Taking medication as prescribed: Yes. Toleration medication: Yes. Family/Significant other contact made: No, will contact:  if cosent is given Patient understands diagnosis: Yes. Discussing patient identified problems/goals with staff: Yes. Medical problems stabilized or resolved: Yes. Denies suicidal/homicidal ideation: Yes. Issues/concerns per patient self-inventory: No.   New problem(s) identified: No, Describe:  none  New Short Term/Long Term Goal(s): medication stabilization, elimination of SI thoughts, development of comprehensive mental wellness plan.   Patient Goals:  "Trying to [facility] my anxiety down"  Discharge Plan or Barriers: Patient recently admitted. CSW will continue to follow and assess for appropriate referrals and possible discharge planning.   Reason for Continuation of Hospitalization: Anxiety Depression Medication stabilization Suicidal ideation  Estimated Length of Stay: 3-5 days   Attendees: Patient: Vanessa Henry  06/14/2020   Physician: Melba Coon, MD 06/14/2020   Nursing:   06/14/2020  RN Care Manager: 06/14/2020  Social Worker: Darletta Moll, [place] 06/14/2020   Recreational Therapist:  06/14/2020  Other:  06/14/2020   Other:  06/14/2020   Other: 06/14/2020      Scribe for Treatment Team: Vassie Moselle, [place] 06/14/2020 12:49 PM

## 2020-06-14 NOTE — Progress Notes (Signed)
°   06/14/20 0629 06/14/20 0631  Vital Signs  Pulse Rate (!) 58 (!) 126  BP 99/73 96/67  BP Location Right Arm Left Arm  BP Method Automatic Automatic  Patient Position (if appropriate) Sitting Standing   Pt came to nurse's station this morning and said she wasn't feeling well and was experiencing some dizziness. This writer walked pt to dayroom where she began to slide down and was helped to a chair. Pt was pale, diaphoretic and VS were assessed. Pt was given (2) 20 oz bottles of Gatorade to drink. The above VS are post drinking Gatorade. Pt states that she feels better. Color has returned. Pt is no longer diaphoretic or dizzy. Pt took Minipress for the first time last night as well as trazodone to sleep. Pt cautioned to change positions slowly. Pt is sitting and being monitored.
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
## 2020-06-14 NOTE — Progress Notes (Signed)
Nutrition Brief Note  Patient identified on the Malnutrition Screening Tool (MST) Report  Pt with no weight loss noted. Appetite has improved now.  Wt Readings from Last 15 Encounters:  06/13/20 54 kg  09/18/16 52.2 kg  04/15/14 55 kg (45 %, Z= -0.13)*   * Growth percentiles are based on CDC (Girls, 2-20 Years) data.    Body mass index is 18.64 kg/m. Patient meets criteria for normal based on current BMI.   Current diet order is regular. Labs and medications reviewed.   No nutrition interventions warranted at this time. If nutrition issues arise, please consult RD.   Tilda Franco, MS, RD, LDN Inpatient Clinical Dietitian Contact information available via Amion

## 2020-06-15 DIAGNOSIS — F4310 Post-traumatic stress disorder, unspecified: Secondary | ICD-10-CM | POA: Diagnosis not present

## 2020-06-15 DIAGNOSIS — F322 Major depressive disorder, single episode, severe without psychotic features: Secondary | ICD-10-CM | POA: Diagnosis not present
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
## 2020-06-15 NOTE — Progress Notes (Signed)
   06/15/20 2318  Psych Admission Type (Psych Patients Only)  Admission Status Voluntary  Psychosocial Assessment  Patient Complaints None  Eye Contact Fair  Facial Expression Flat  Affect Appropriate to circumstance  Speech Logical/coherent  Interaction Assertive  Appearance/Hygiene Unremarkable  Behavior Characteristics Appropriate to situation  Mood Pleasant  Thought Process  Coherency WDL  Content WDL  Delusions None reported or observed  Perception WDL  Hallucination None reported or observed  Judgment WDL  Confusion None  Danger to Self  Current suicidal ideation? Denies  Danger to Others  Danger to Others None reported or observed

## 2020-06-15 NOTE — Progress Notes (Signed)
   06/15/20 0900  Psych Admission Type (Psych Patients Only)  Admission Status Voluntary  Psychosocial Assessment  Patient Complaints None  Eye Contact Fair  Facial Expression Anxious  Affect Appropriate to circumstance  Speech Logical/coherent  Interaction Assertive  Motor Activity Other (Comment) (WDL)  Appearance/Hygiene Unremarkable  Behavior Characteristics Cooperative  Mood Pleasant  Thought Process  Coherency WDL  Content WDL  Delusions None reported or observed  Perception WDL  Hallucination None reported or observed  Judgment Poor  Confusion None  Danger to Self  Current suicidal ideation? Denies  Danger to Others  Danger to Others None reported or observed   Pt reports she is feeling better and rates her depression as a 1.5 with 10 being the worst. She would like to have outpatient therapy upon discharge. Offered support, encouragement and 15 minute checks. Pt denies si and hi. Safety maintained on the unit.

## 2020-06-15 NOTE — Plan of Care (Signed)
Depression has decreased since admission rates 1.5.

## 2020-06-15 NOTE — Progress Notes (Signed)
[facility] MD Progress Note  06/15/2020 2:29 PM Vanessa Henry  MRN:  [id number]   Subjective:  " I am glad I came here for treatment" Patient was seen today for daily evaluation.  She was seen in group therapy this morning and later writing  and coloring.  She states she is happy she came in for treatment.  She also is happy with the structure of treatment especially group therapy.  She reports that she has been upset and angry regarding her work environment.  She also states that her family dynamics causes her depression and anxiety.  She is focusing on coping skills and plans to leave her toxic work environment.  Patient reports good night sleep and improved appetite.  She plans to engage in outpatient therapy after hospitalization.  Patient denies suicide ideation or any suicide thoughts.  No mention of Psychotic symptoms.  Principal Problem: MDD (major depressive disorder) Diagnosis: Principal Problem:   MDD (major depressive disorder) Active Problems:   PTSD (post-traumatic stress disorder)  Total Time spent with patient: 20 minutes  Past Psychiatric History: Anxiety- has been on Buspar and Lexapro, three previous suicide attempts   Past Medical History:  Past Medical History:  Diagnosis Date   Asthma    UTI (lower urinary tract infection)    History reviewed. No pertinent surgical history. Family History: History reviewed. No pertinent family history. Family Psychiatric  History: Sister-Bipolar disorder Aunt-Schizophrenia Suicide -Three family members completed suicide Social History:  Social History   Substance and Sexual Activity  Alcohol Use Yes     Social History   Substance and Sexual Activity  Drug Use No    Social History   Socioeconomic History   Marital status: Significant Other    Spouse name: Not on file   Number of children: Not on file   Years of education: college   Highest education level: Not on file  Occupational History   Not on file   Tobacco Use   Smoking status: Current Every Day Smoker   Smokeless tobacco: Never Used  Vaping Use   Vaping Use: Every day  Substance and Sexual Activity   Alcohol use: Yes   Drug use: No   Sexual activity: Yes    Birth control/protection: I.U.D.  Other Topics Concern   Not on file  Social History Narrative   Not on file   Social Determinants of Health   Financial Resource Strain:    Difficulty of Paying Living Expenses: Not on file  Food Insecurity:    Worried About Running Out of Food in the Last Year: Not on file   Ran Out of Food in the Last Year: Not on file  Transportation Needs:    Lack of Transportation (Medical): Not on file   Lack of Transportation (Non-Medical): Not on file  Physical Activity:    Days of Exercise per Week: Not on file   Minutes of Exercise per Session: Not on file  Stress:    Feeling of Stress : Not on file  Social Connections:    Frequency of Communication with Friends and Family: Not on file   Frequency of Social Gatherings with Friends and Family: Not on file   Attends Religious Services: Not on file   Active Member of Clubs or Organizations: Not on file   Attends Banker Meetings: Not on file   Marital Status: Not on file   Additional Social History:  Sleep: Good  Appetite:  Fair  Current Medications: Current Facility-Administered Medications  Medication Dose Route Frequency Provider Last Rate Last Admin   acetaminophen (TYLENOL) tablet 650 mg  650 mg Oral Q6H PRN Dagar, Geralynn Rile, MD   650 mg at 06/14/20 1233   alum & mag hydroxide-simeth (MAALOX/MYLANTA) 200-200-20 MG/5ML suspension 30 mL  30 mL Oral Q6H PRN Dagar, Geralynn Rile, MD   30 mL at 06/14/20 2123   busPIRone (BUSPAR) tablet 10 mg  10 mg Oral TID Antonieta Pert, MD   10 mg at 06/15/20 1153   escitalopram (LEXAPRO) tablet 20 mg  20 mg Oral QHS Antonieta Pert, MD   20 mg at 06/14/20 2122   famotidine  (PEPCID) tablet 20 mg  20 mg Oral BID Antonieta Pert, MD   20 mg at 06/15/20 0805   feeding supplement (ENSURE ENLIVE) (ENSURE ENLIVE) liquid 237 mL  237 mL Oral BID BM Dagar, Anjali, MD   237 mL at 06/15/20 1015   hydrOXYzine (ATARAX/VISTARIL) tablet 25 mg  25 mg Oral TID PRN Dagar, Geralynn Rile, MD       magnesium hydroxide (MILK OF MAGNESIA) suspension 30 mL  30 mL Oral Daily PRN Dagar, Geralynn Rile, MD       nicotine polacrilex (NICORETTE) gum 2 mg  2 mg Oral PRN Nira Conn A, NP   2 mg at 06/15/20 1016   traZODone (DESYREL) tablet 50 mg  50 mg Oral QHS PRN Dagar, Geralynn Rile, MD   50 mg at 06/13/20 2143    Lab Results:  Results for orders placed or performed during the hospital encounter of 06/13/20 (from the past 48 hour(s))  Urine rapid drug screen (hosp performed)not at [facility]     Status: None   Collection Time: 06/14/20 11:31 AM  Result Value Ref Range   Opiates NONE DETECTED NONE DETECTED   Cocaine NONE DETECTED NONE DETECTED   Benzodiazepines NONE DETECTED NONE DETECTED   Amphetamines NONE DETECTED NONE DETECTED   Tetrahydrocannabinol NONE DETECTED NONE DETECTED   Barbiturates NONE DETECTED NONE DETECTED    Comment: (NOTE) DRUG SCREEN FOR MEDICAL PURPOSES ONLY.  IF CONFIRMATION IS NEEDED FOR ANY PURPOSE, NOTIFY LAB WITHIN 5 DAYS.  LOWEST DETECTABLE LIMITS FOR URINE DRUG SCREEN Drug Class                     Cutoff (ng/mL) Amphetamine and metabolites    1000 Barbiturate and metabolites    200 Benzodiazepine                 200 Tricyclics and metabolites     300 Opiates and metabolites        300 Cocaine and metabolites        300 THC                            50 Performed at [facility], [address] [place], [place] [address]   Pregnancy, urine     Status: None   Collection Time: 06/14/20 11:31 AM  Result Value Ref Range   Preg Test, Ur NEGATIVE NEGATIVE    Comment:        THE SENSITIVITY OF THIS METHODOLOGY IS >20 mIU/mL. Performed at [facility], [address], [place], [place] [address]   TSH     Status: None   Collection Time: 06/14/20  6:06 PM  Result Value Ref Range   TSH 1.373  0.350 - 4.500 uIU/mL    Comment: Performed by a 3rd Generation assay with a functional sensitivity of <=0.01 uIU/mL. Performed at [facility], [address] [place], [place] [address]   Lipid panel     Status: None   Collection Time: 06/14/20  6:06 PM  Result Value Ref Range   Cholesterol 140 0 - 200 mg/dL   Triglycerides 55 <782<150 mg/dL   HDL 58 >95>40 mg/dL   Total CHOL/HDL Ratio 2.4 RATIO   VLDL 11 0 - 40 mg/dL   LDL Cholesterol 71 0 - 99 mg/dL    Comment:        Total Cholesterol/HDL:CHD Risk Coronary Heart Disease Risk Table                     Men   Women  1/2 Average Risk   3.4   3.3  Average Risk       5.0   4.4  2 X Average Risk   9.6   7.1  3 X Average Risk  23.4   11.0        Use the calculated Patient Ratio above and the CHD Risk Table to determine the patient's CHD Risk.        ATP III CLASSIFICATION (LDL):  <100     mg/dL   Optimal  [id number]  mg/dL   Near or Above                    Optimal  130-159  mg/dL   Borderline  [id number]  mg/dL   High  >962>190     mg/dL   Very High Performed at [facility], [address] [place], [place] [address]   Hemoglobin A1c     Status: None   Collection Time: 06/14/20  6:06 PM  Result Value Ref Range   Hgb A1c MFr Bld 4.8 4.8 - 5.6 %    Comment: (NOTE) Pre diabetes:          5.7%-6.4%  Diabetes:              >6.4%  Glycemic control for   <7.0% adults with diabetes    Mean Plasma Glucose 91.06 mg/dL    Comment: Performed at [facility], [address] [place], [place] [address]  Ethanol     Status: None   Collection Time: 06/14/20  6:06 PM  Result Value Ref Range   Alcohol, Ethyl (B) <10 <10 mg/dL    Comment: (NOTE) Lowest detectable limit for serum alcohol is 10 mg/dL.  For medical purposes only. Performed  at [facility], [address] [place], [place] [address]   Comprehensive metabolic panel     Status: Abnormal   Collection Time: 06/14/20  6:06 PM  Result Value Ref Range   Sodium 140 135 - 145 mmol/L   Potassium 3.7 3.5 - 5.1 mmol/L   Chloride 106 98 - 111 mmol/L   CO2 24 22 - 32 mmol/L   Glucose, Bld 115 (H) 70 - 99 mg/dL    Comment: Glucose reference range applies only to samples taken after fasting for at least 8 hours.   BUN 13 6 - 20 mg/dL   Creatinine, Ser 2.530.64 0.44 - 1.00 mg/dL   Calcium 9.5 8.9 - 66.410.3 mg/dL   Total Protein 7.3 6.5 - 8.1 g/dL   Albumin 4.6 3.5 - 5.0 g/dL   AST 19 15 - 41 U/L  ALT 16 0 - 44 U/L   Alkaline Phosphatase 49 38 - 126 U/L   Total Bilirubin 0.5 0.3 - 1.2 mg/dL   GFR calc non Af Amer >60 >60 mL/min   GFR calc Af Amer >60 >60 mL/min   Anion gap 10 5 - 15    Comment: Performed at [facility], [address] [place], [place] [address]  CBC     Status: None   Collection Time: 06/14/20  6:06 PM  Result Value Ref Range   WBC 5.9 4.0 - 10.5 K/uL   RBC 4.01 3.87 - 5.11 MIL/uL   Hemoglobin 13.2 12.0 - 15.0 g/dL   HCT 99.3 36 - 46 %   MCV 94.3 80.0 - 100.0 fL   MCH 32.9 26.0 - 34.0 pg   MCHC 34.9 30.0 - 36.0 g/dL   RDW 71.6 96.7 - 89.3 %   Platelets 241 150 - 400 K/uL   nRBC 0.0 0.0 - 0.2 %    Comment: Performed at [facility], [address] [place], [place] [address]    Blood Alcohol level:  Lab Results  Component Value Date   ETH <10 06/14/2020    Metabolic Disorder Labs: Lab Results  Component Value Date   HGBA1C 4.8 06/14/2020   MPG 91.06 06/14/2020   No results found for: PROLACTIN Lab Results  Component Value Date   CHOL 140 06/14/2020   TRIG 55 06/14/2020   HDL 58 06/14/2020   CHOLHDL 2.4 06/14/2020   VLDL 11 06/14/2020   LDLCALC 71 06/14/2020    Physical Findings: AIMS: Facial and Oral Movements Muscles of Facial Expression: None, normal Lips and Perioral  Area: None, normal Jaw: None, normal Tongue: None, normal,Extremity Movements Upper (arms, wrists, hands, fingers): None, normal Lower (legs, knees, ankles, toes): None, normal, Trunk Movements Neck, shoulders, hips: None, normal, Overall Severity Severity of abnormal movements (highest score from questions above): None, normal Incapacitation due to abnormal movements: None, normal Patient's awareness of abnormal movements (rate only patient's report): No Awareness, Dental Status Current problems with teeth and/or dentures?: No Does patient usually wear dentures?: No  CIWA:    COWS:     Musculoskeletal: Strength & Muscle Tone: within normal limits Gait & Station: normal Patient leans: N/A  Psychiatric Specialty Exam: Physical Exam Vitals and nursing note reviewed.  Constitutional:      Appearance: Normal appearance. Vanessa Henry "Vanessa Henry" is normal weight.  HENT:     Head: Normocephalic and atraumatic.  Cardiovascular:     Rate and Rhythm: Normal rate.  Musculoskeletal:        General: Normal range of motion.     Cervical back: Normal range of motion.  Neurological:     General: No focal deficit present.     Mental Status: Vanessa Henry. Arntz "Vanessa Henry" is alert and oriented to person, place, and time.  Psychiatric:        Attention and Perception: Attention and perception normal.        Mood and Affect: Mood and affect normal.        Speech: Speech normal.        Behavior: Behavior normal. Behavior is cooperative.        Thought Content: Thought content normal.        Cognition and Memory: Cognition and memory normal.        Judgment: Judgment normal.     Review of Systems  HENT: Negative.   Eyes: Negative.   Respiratory:  Negative.   Gastrointestinal: Positive for constipation.  Endocrine: Negative.   Musculoskeletal: Negative.   Neurological: Negative.   Psychiatric/Behavioral: Negative for dysphoric mood and sleep disturbance.    Blood pressure 108/69, pulse  87, temperature 97.9 F (36.6 C), temperature source Oral, resp. rate 16, height 5\' 7"  (1.702 m), weight 54 kg, SpO2 100 %.Body mass index is 18.64 kg/m.  General Appearance: Casual  Eye Contact:  Good  Speech:  Normal Rate  Volume:  Normal  Mood:  Euthymic  Affect:  Appropriate  Thought Process:  Linear and Descriptions of Associations: Intact  Orientation:  Full (Time, Place, and Person)  Thought Content:  Logical  Suicidal Thoughts:  No  Homicidal Thoughts:  No  Memory:  Immediate;   Good Recent;   Good  Judgement:  Intact  Insight:  Fair  Psychomotor Activity:  Normal  Concentration:  Concentration: Good and Attention Span: Good  Recall:  Good  Fund of Knowledge:  Good  Language:  Good  Akathisia:  No  Handed:  Right  AIMS (if indicated):     Assets:  Communication Skills Desire for Improvement Financial Resources/Insurance Housing Resilience Social Support  ADL's:  Intact  Cognition:  WNL  Sleep:  Number of Hours: 6.75   Assessment: Patient is a 24 year old female with a reported past psychiatric history significant for posttraumatic stress disorder, major depression as well as generalized anxiety disorder who presented to the behavioral health hospital as a walk-in evaluation on 06/13/2020. Patient stated that she had a chronic history of anxiety, depression and PTSD. Lab results are wnl. Treatment Plan Summary: Daily contact with patient to assess and evaluate symptoms and progress in treatment.  Plan: - Continue Buspar 10 mg PO TID for anxiety. -.Continue Lexapro 20 mg PO QHS for depression. -.Continue Ensure enlive 237 ml for nutritional supplement. -.Continue  Nicotine gum 2 mg for nicotine dependence.   -Continue Trazodone 50 mg Po QHS PRN for insomnia.  - Minipress 0.5 mg PO PRN for nightmares.  -Encouragement to attend group therapies. - Disposition in progress.  08/13/2020, NP 06/15/2020, 2:29 PM

## 2020-06-15 NOTE — BHH Group Notes (Signed)
LCSW Group Therapy Note  06/15/2020    11:00am-12:00pm  Type of Therapy and Topic:  Group Therapy: Anger and Coping Skills  Participation Level:  Active   Description of Group:   In this group, patients learned how to recognize the physical, cognitive, emotional, and behavioral responses they have to anger-provoking situations.  They identified how they usually or often react when angered, and learned how healthy and unhealthy coping skills work initially, but the unhealthy ones stop working.   They analyzed how their frequently-chosen coping skill is possibly beneficial and how it is possibly unhelpful.  The group discussed a variety of healthier coping skills that could help in resolving the actual issues, as well as how to go about planning for the the possibility of future similar situations.  Therapeutic Goals: 1. Patients will identify one thing that makes them angry and how they feel emotionally and physically, what their thoughts are or tend to be in those situations, and what healthy or unhealthy coping mechanism they typically use 2. Patients will identify how their coping technique works for them, as well as how it works against them. 3. Patients will explore possible new behaviors to use in future anger situations. 4. Patients will learn that anger itself is normal and cannot be eliminated, and that healthier coping skills can assist with resolving conflict rather than worsening situations.  Summary of Patient Progress:  The patient shared that she often is angered by being taken advantage of, especially of her being nice to someone.  Other group members related to this.  She shared a number of times in group and seemed comfortable with showing vulnerability.  She received much positive feedback in return.    Therapeutic Modalities:   Cognitive Behavioral Therapy Motivation Interviewing  Lynnell Chad  .

## 2020-06-15 NOTE — Progress Notes (Signed)
   06/15/20 0001  Psych Admission Type (Psych Patients Only)  Admission Status Voluntary  Psychosocial Assessment  Patient Complaints None  Eye Contact Fair  Facial Expression Anxious  Affect Depressed;Sad;Anxious  Speech Logical/coherent  Interaction Assertive  Motor Activity Other (Comment) (wnl)  Appearance/Hygiene Unremarkable  Behavior Characteristics Cooperative  Mood Pleasant  Thought Process  Coherency WDL  Content WDL  Delusions None reported or observed  Perception WDL  Hallucination None reported or observed  Judgment Poor  Confusion None  Danger to Self  Current suicidal ideation? Denies  Danger to Others  Danger to Others None reported or observed

## 2020-06-15 NOTE — Plan of Care (Signed)
Pt has been out in the dayroom interacting with peers.
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
## 2020-06-15 NOTE — Progress Notes (Signed)
   06/15/20 2311  COVID-19 Daily Checkoff  Have you had a fever (temp > 37.80C/100F)  in the past 24 hours?  No  If you have had runny nose, nasal congestion, sneezing in the past 24 hours, has it worsened? No  COVID-19 EXPOSURE  Have you traveled outside the state in the past 14 days? No  Have you been in contact with someone with a confirmed diagnosis of COVID-19 or PUI in the past 14 days without wearing appropriate PPE? No  Have you been living in the same home as a person with confirmed diagnosis of COVID-19 or a PUI (household contact)? No  Have you been diagnosed with COVID-19? No

## 2020-06-15 NOTE — [facility] Suicide Risk Assessment (Signed)
[facility] INPATIENT:  Family/Significant Other Suicide Prevention Education  Suicide Prevention Education:  Education Completed; Marchia Meiers Gibson Ramp [phone] has been identified by the patient as the family member/significant other with whom the patient will be residing, and identified as the person(s) who will aid the patient in the event of a mental health crisis (suicidal ideations/suicide attempt).  With written consent from the patient, the family member/significant other has been provided the following suicide prevention education, prior to the and/or following the discharge of the patient.  The suicide prevention education provided includes the following:  Suicide risk factors  Suicide prevention and interventions  National Suicide Hotline telephone number  [facility] assessment telephone number  [place] Emergency Assistance 911  [facility] and/or Residential Mobile Crisis Unit telephone number  Request made of family/significant other to:  Remove weapons (e.g., guns, rifles, knives), all items previously/currently identified as safety concern.    Remove drugs/medications (over-the-counter, prescriptions, illicit drugs), all items previously/currently identified as a safety concern.  The family member/significant other verbalizes understanding of the suicide prevention education information provided.  The family member/significant other agrees to remove the items of safety concern listed above.     Fiance lives with pt and confirms that a firearm has been removed from the home and relocated to a friend's house. Fiance has no additional safety concerns regarding discharge within the next 24 hours.   Fiance verbalizes understanding of crisis resources and outpatient recommendations. No additional questions or concerns, call back information provided should questions or concerns arise prior to discharge.    Darreld Mclean 06/15/2020, 11:18 AM

## 2020-06-15 NOTE — [facility] Group Notes (Signed)
Adult Psychoeducational Group Note  Date:  06/15/2020 Time:  11:12 AM  Group Topic/Focus:  Goals Group:   The focus of this group is to help patients establish daily goals to achieve during treatment and discuss how the patient can incorporate goal setting into their daily lives to aide in recovery.  Participation Level:  Active  Participation Quality:  Appropriate  Affect:  Appropriate  Cognitive:  Oriented  Insight: Appropriate  Engagement in Group:  Engaged  Modes of Intervention:  Discussion, Education, Exploration and Support  Additional Comments:  Pt rates her energy level as a 10/10. States her goal today is to speak with the doctor about discharge.  Vanessa Henry A 06/15/2020, 11:12 AM

## 2020-06-16 MED ORDER — ESCITALOPRAM OXALATE 20 MG PO TABS: 20.0000 mg | ORAL_TABLET | Freq: Every day | ORAL | 0 refills | Status: AC

## 2020-06-16 MED ORDER — HYDROXYZINE HCL 25 MG PO TABS: 25.0000 mg | ORAL_TABLET | Freq: Three times a day (TID) | ORAL | 0 refills | Status: AC | PRN

## 2020-06-16 MED ORDER — BUSPIRONE HCL 10 MG PO TABS
10.0000 mg | ORAL_TABLET | Freq: Three times a day (TID) | ORAL | 0 refills | Status: AC
Start: 2020-06-16 — End: 2020-07-16

## 2020-06-16 MED ORDER — NICOTINE POLACRILEX 2 MG MT GUM
2.0000 mg | CHEWING_GUM | OROMUCOSAL | 0 refills | Status: AC | PRN
Start: 2020-06-16 — End: ?

## 2020-06-16 NOTE — [facility] Suicide Risk Assessment (Signed)
[facility] Discharge Suicide Risk Assessment   Principal Problem: MDD (major depressive disorder) Discharge Diagnoses: Principal Problem:   MDD (major depressive disorder) Active Problems:   PTSD (post-traumatic stress disorder)   Total Time spent with patient: 30 minutes   Vanessa Henry is a 24 y.o. adult with a reported history of PTSD, depression and anxiety.  Who presented with suicidal ideation related to current life stressors.  Patient was started on Lexapro and buspirone for anxiety and depression as well as trazodone at bedtime for sleep.  Patient reported significant improvement in symptoms.  She attended groups which she found helpful.  On evaluation today, she stated that she has appreciated group therapy.  At today's session she wondered about setting boundaries and recognizing healthy and unhealthy relationships.  She states that she is going to make some of these changes in her life, especially in regards to her work setting.  Patient confirms that her gun has been secured by her boyfriend.  She specifically denies any suicidal ideation, plan, or intent.  She is denying any homicidal ideation.  She states that she has had vague hallucinations in the past with severe depression.  She does note that she has not had any auditory or visual hallucinations since admission.    Musculoskeletal: Strength & Muscle Tone: within normal limits Gait & Station: normal Patient leans: N/A  Psychiatric Specialty Exam: Review of Systems  Constitutional: Negative.   HENT: Negative.   Respiratory: Negative.   Cardiovascular: Negative.   Gastrointestinal: Negative.   Musculoskeletal: Negative.   Neurological: Negative.   Psychiatric/Behavioral: Negative for agitation, behavioral problems, confusion, decreased concentration, dysphoric mood, hallucinations, self-injury, sleep disturbance and suicidal ideas. The patient is not nervous/anxious and is not hyperactive.     Blood pressure 94/64,  pulse 86, temperature 98.6 F (37 C), temperature source Oral, resp. rate 16, height 5\' 7"  (1.702 m), weight 54 kg, SpO2 98 %.Body mass index is 18.64 kg/m.  General Appearance: Casual and Well Groomed  Eye Contact::  Good  Speech:  Clear and Coherent and Normal Rate409  Volume:  Normal  Mood:  Euthymic  Affect:  Appropriate and Congruent  Thought Process:  Coherent  Orientation:  Full (Time, Place, and Person)  Thought Content:  Logical and Hallucinations: None  Suicidal Thoughts:  No  Homicidal Thoughts:  No  Memory:  Immediate;   Good Recent;   Good Remote;   Good  Judgement:  Fair  Insight:  Good  Psychomotor Activity:  Normal  Concentration:  Good  Recall:  Good  Fund of Knowledge:Good  Language: Good  Akathisia:  No  Handed:  Right  AIMS (if indicated):     Assets:  Communication Skills Desire for Improvement Financial Resources/Insurance Housing Physical Health Resilience Vocational/Educational  Sleep:  Number of Hours: 6.75  Cognition: WNL  ADL's:  Intact   Mental Status Per Nursing Assessment::   On Admission:  Suicidal ideation indicated by patient  Demographic Factors:  Caucasian  Loss Factors: work related stress  Historical Factors: Prior suicide attempts, Family history of suicide, Family history of mental illness or substance abuse, Impulsivity and Physical and emotional trauma from a very young age to age 51.  Risk Reduction Factors:   Employed, Positive social support and Positive coping skills or problem solving skills  Continued Clinical Symptoms:  Anxiety  Depression  Cognitive Features That Contribute To Risk:  None    Suicide Risk:  Minimal: No identifiable suicidal ideation.  Patients presenting with no risk factors but  with morbid ruminations; may be classified as minimal risk based on the severity of the depressive symptoms   Follow-up Information    [facility]. Call on 06/18/2020.   Specialty:  Behavioral Health Why: Please call to schedule an appointment for medication management and therapy. Walk in hours are available Friday mornings.  Contact information: [address] [place] [address] [phone]       [facility] Follow up.   Specialty: Professional Counselor Why: This provider offers medication management and outpatient therapy. New clients must use walk in hours M-F 8am-12:30pm for an initial assessment. Please bring photo ID, proof of insurance and income, and hospital discharge paperwork with you.  Contact information: [facility] [place] [address] [place] [address] [phone]               Plan Of Care/Follow-up recommendations:  Activity:  as tolerated Diet:  as tolerated Other:  discharge instructions as per AVS   On day of discharge following sustained improvement in the affect of this patient, continued report of euthymic mood, repeated denial of suicidal, homicidal, and other violent ideation, adequate interaction with peers, active participation in groups while on the unit, and denial of adverse reactions from medications, the treatment team decided Vanessa Henry was stable for discharge home with scheduled mental health.  She was able to engage in safety planning including plan to return to nearest emergency room or contact emergency services if she feels unable to maintain her own safety or the safety of others. Patient had no further questions, comments, or concerns.  Discharge into care of friend, who agrees to maintain patient safety.  Patient aware to return to nearest crisis center, ED or to call 911 for worsening symptoms of depression, suicidal or homicidal thoughts or AVH.     Mariel Craft, MD 06/16/2020, 2:19 PM

## 2020-06-16 NOTE — Plan of Care (Signed)
Discharge note  Patient verbalizes readiness for discharge. Follow up plan explained, AVS, Transition record and SRA given. Prescriptions and teaching provided. Belongings returned and signed for. Suicide safety plan completed and signed. Patient verbalizes understanding. Patient denies SI/HI and assures this Clinical research associate they will seek assistance should that change. Patient discharged to lobby where husband was waiting.  Problem: Education: Goal: Ability to state activities that reduce stress will improve Outcome: Adequate for Discharge   Problem: Coping: Goal: Ability to identify and develop effective coping behavior will improve Outcome: Adequate for Discharge   Problem: Self-Concept: Goal: Ability to identify factors that promote anxiety will improve Outcome: Adequate for Discharge Goal: Level of anxiety will decrease Outcome: Adequate for Discharge Goal: Ability to modify response to factors that promote anxiety will improve Outcome: Adequate for Discharge   Problem: Education: Goal: Knowledge of [employer] General Education information/materials will improve Outcome: Adequate for Discharge Goal: Emotional status will improve Outcome: Adequate for Discharge Goal: Mental status will improve Outcome: Adequate for Discharge Goal: Verbalization of understanding the information provided will improve Outcome: Adequate for Discharge   Problem: Activity: Goal: Interest or engagement in activities will improve Outcome: Adequate for Discharge Goal: Sleeping patterns will improve Outcome: Adequate for Discharge   Problem: Coping: Goal: Ability to verbalize frustrations and anger appropriately will improve Outcome: Adequate for Discharge Goal: Ability to demonstrate self-control will improve Outcome: Adequate for Discharge   Problem: Health Behavior/Discharge Planning: Goal: Identification of resources available to assist in meeting health care needs will improve Outcome: Adequate for  Discharge Goal: Compliance with treatment plan for underlying cause of condition will improve Outcome: Adequate for Discharge   Problem: Physical Regulation: Goal: Ability to maintain clinical measurements within normal limits will improve Outcome: Adequate for Discharge   Problem: Safety: Goal: Periods of time without injury will increase Outcome: Adequate for Discharge   Problem: Education: Goal: Utilization of techniques to improve thought processes will improve Outcome: Adequate for Discharge Goal: Knowledge of the prescribed therapeutic regimen will improve Outcome: Adequate for Discharge   Problem: Activity: Goal: Interest or engagement in leisure activities will improve Outcome: Adequate for Discharge Goal: Imbalance in normal sleep/wake cycle will improve Outcome: Adequate for Discharge   Problem: Coping: Goal: Coping ability will improve Outcome: Adequate for Discharge Goal: Will verbalize feelings Outcome: Adequate for Discharge   Problem: Health Behavior/Discharge Planning: Goal: Ability to make decisions will improve Outcome: Adequate for Discharge Goal: Compliance with therapeutic regimen will improve Outcome: Adequate for Discharge   Problem: Role Relationship: Goal: Will demonstrate positive changes in social behaviors and relationships Outcome: Adequate for Discharge   Problem: Safety: Goal: Ability to disclose and discuss suicidal ideas will improve Outcome: Adequate for Discharge Goal: Ability to identify and utilize support systems that promote safety will improve Outcome: Adequate for Discharge   Problem: Self-Concept: Goal: Will verbalize positive feelings about self Outcome: Adequate for Discharge Goal: Level of anxiety will decrease Outcome: Adequate for Discharge

## 2020-06-16 NOTE — [facility] Group Notes (Signed)
Adult Psychoeducational Group Not Date:  06/16/2020 Time:  9:00am-9:45am  Group Topic/Focus: PROGRESSIVE RELAXATION. A group where deep breathing is taught and tensing and relaxation muscle groups is used. Imagery is used as well.  Pts are asked to imagine 3 pillars that hold them up when they are not able to hold themselves up.  Participation Level:  Active  Participation Quality:  Appropriate  Affect:  Appropriate  Cognitive:  Oriented  Insight: Improving  Engagement in Group:  Engaged  Modes of Intervention:  Activity, Discussion, Education, and Support  Additional Comments:  Pt states that her energy level is 7 or 8/10,. Love, her ability to express herself through the arts and that she is able to learn, hold her up.  Vanessa Henry 06/16/2020

## 2020-06-16 NOTE — [facility] Group Notes (Signed)
[facility] LCSW Group Therapy Note  06/16/2020    Type of Therapy and Topic:  Group Therapy:  A Hero Worthy of Support  Participation Level:  Active   Description of Group:  Patients in this group were introduced to the concept that additional supports including self-support are an essential part of recovery.  Matching needs with supports to help fulfill those needs was explained.  Establishing boundaries that can gradually be increased or decreased was described, with patients giving their own examples of establishing appropriate boundaries in their lives.  A song entitled "My Own Hero" was played and a group discussion ensued in which patients stated it inspired them to help themselves in order to succeed, because other people cannot achieve their goals such as sobriety or stability for them.  A song was played called "I Am Enough" which led to a discussion about being willing to believe we are worth the effort of being a self-support.   Therapeutic Goals: 1)  demonstrate the importance of being a key part of one's own support system 2)  discuss various available supports 3)  encourage patient to use music as part of their self-support and focus on goals 4)  elicit ideas from patients about supports that need to be added   Summary of Patient Progress:  The patient expressed that her fiance and sisters are healthy supports.  She said that every friend she has currently is not a healthy supporter.  Also, she stated that she tends to choose better people to be around, because right now she has been choosing bad people and they cannot be healthy for her.  Therapeutic Modalities:   Motivational Interviewing Activity  Lynnell Chad

## 2020-06-16 NOTE — Progress Notes (Signed)
  [facility] Adult Case Management Discharge Plan :  Will you be returning to the same living situation after discharge:  Yes,  with boyfriend At discharge, do you have transportation home?: Yes,  boyfriend Do you have the ability to pay for your medications: No.  Release of information consent forms completed and emailed to Medical Records, then turned in to Medical Records by CSW.   Patient to Follow up at:  Follow-up Information    [facility]. Call on 06/18/2020.   Specialty: Behavioral Health Why: Please call to schedule an appointment for medication management and therapy. Walk in hours are available Friday mornings.  Contact information: [address] [place] [address] [phone]       [facility] Follow up.   Specialty: Professional Counselor Why: This provider offers medication management and outpatient therapy. New clients must use walk in hours M-F 8am-12:30pm for an initial assessment. Please bring photo ID, proof of insurance and income, and hospital discharge paperwork with you.  Contact information: [facility] [place] [address] [place] [address] [phone]               Next level of care provider has access to [facility] Link:yes  Safety Planning and Suicide Prevention discussed: Yes,  with boyfriend     Has patient been referred to the Quitline?: N/A patient is not a smoker  Patient has been referred for addiction treatment: N/A  Lynnell Chad, LCSW 06/16/2020, 10:14 AM

## 2020-06-16 NOTE — Discharge Summary (Signed)
Physician Discharge Summary Note  Patient:  Vanessa Henry. Vanessa Henry is an 24 y.o., adult MRN:  [id number] DOB:  [birth year] Patient phone:  [phone] (home)  Patient address:   [address] [place] [address],  Total Time spent with patient: 30 minutes  Date of Admission:  06/13/2020 Date of Discharge: 06/16/2020  Reason for Admission: Patient is a 24 year old female with a reported past psychiatric history significant for posttraumatic stress disorder, major depression as well as generalized anxiety disorder who presented to the behavioral health hospital as a walk-in evaluation on 06/13/2020. Patient stated that she had a chronic history of anxiety, depression and PTSD. She was admitted for stabilization and further evaluation.  Principal Problem: MDD (major depressive disorder) Discharge Diagnoses: Principal Problem:   MDD (major depressive disorder) Active Problems:   PTSD (post-traumatic stress disorder)   Past Psychiatric History: Patient denied any previous psychiatric admissions. Her first exposure to psychiatric treatment was at [facility] in [place] in 2020. The only two medicines she has been treated with in the past were Lexapro and BuSpar. She stated that she had attempted to hang herself on at least three occasions in her lifetime, but had not been successful. She had been stopped by roommates or friends. She had no hospitalization secondary to that.   Past Medical History:  Past Medical History:  Diagnosis Date  . Asthma   . UTI (lower urinary tract infection)    History reviewed. No pertinent surgical history. Family History: History reviewed. No pertinent family history. Family Psychiatric  History: Patient stated that her sister had bipolar disorder, she had an aunt with schizophrenia. She also stated that she had three family members who have died of suicide. Social History:  Social History   Substance and Sexual Activity  Alcohol Use Yes     Social  History   Substance and Sexual Activity  Drug Use No    Social History   Socioeconomic History  . Marital status: Significant Other    Spouse name: Not on file  . Number of children: Not on file  . Years of education: college  . Highest education level: Not on file  Occupational History  . Not on file  Tobacco Use  . Smoking status: Current Every Day Smoker  . Smokeless tobacco: Never Used  Vaping Use  . Vaping Use: Every day  Substance and Sexual Activity  . Alcohol use: Yes  . Drug use: No  . Sexual activity: Yes    Birth control/protection: I.U.D.  Other Topics Concern  . Not on file  Social History Narrative  . Not on file   Social Determinants of Health   Financial Resource Strain:   . Difficulty of Paying Living Expenses: Not on file  Food Insecurity:   . Worried About Programme researcher, broadcasting/film/video in the Last Year: Not on file  . Ran Out of Food in the Last Year: Not on file  Transportation Needs:   . Lack of Transportation (Medical): Not on file  . Lack of Transportation (Non-Medical): Not on file  Physical Activity:   . Days of Exercise per Week: Not on file  . Minutes of Exercise per Session: Not on file  Stress:   . Feeling of Stress : Not on file  Social Connections:   . Frequency of Communication with Friends and Family: Not on file  . Frequency of Social Gatherings with Friends and Family: Not on file  . Attends Religious Services: Not on file  .  Active Member of Clubs or Organizations: Not on file  . Attends BankerClub or Organization Meetings: Not on file  . Marital Status: Not on file    Hospital Course:  Patient was started on Buspar 10 mg PO TID for anxiety and Lexapro 20 mg Po QHS for depression. She was provided with Trazodone 50 mg QHS PRN for insomnia and Minipress 1 mg for nightmares.  She shows improved mood and affect, denies SI or nightmares. She had dizziness following morning after taking Minipress 1 mg and Trazodone 50 mg. She was offered  minipress 0.5 mg for nightmares but she denies it at this time and also denies Trazodone at this time. Both medications were left as PRN.  Discharge medications:  1. Buspar 10 mg PO TID 2. Lexapro 20 mg Po QHS 3. Vistaril 25 mg PRN  On the day of discharge patient patient presents with euthymic mood, denies suicidal ideations, homicidal ideations. She denies any auditory or visual hallucinations. Denies any medication side effects.  Boyfriend was called and informed about the discharge with patient present in the room. Made sure about no access to guns.   Physical Findings: AIMS: Facial and Oral Movements Muscles of Facial Expression: None, normal Lips and Perioral Area: None, normal Jaw: None, normal Tongue: None, normal,Extremity Movements Upper (arms, wrists, hands, fingers): None, normal Lower (legs, knees, ankles, toes): None, normal, Trunk Movements Neck, shoulders, hips: None, normal, Overall Severity Severity of abnormal movements (highest score from questions above): None, normal Incapacitation due to abnormal movements: None, normal Patient's awareness of abnormal movements (rate only patient's report): No Awareness, Dental Status Current problems with teeth and/or dentures?: No Does patient usually wear dentures?: No  CIWA:    COWS:     Musculoskeletal: Strength & Muscle Tone: within normal limits Gait & Station: normal Patient leans: N/A  Psychiatric Specialty Exam: Physical Exam Vitals and nursing note reviewed.  Constitutional:      Appearance: Normal appearance. Vanessa Billarolyn J. Sublette "Iran Sizernid" is normal weight.  HENT:     Head: Normocephalic and atraumatic.  Cardiovascular:     Rate and Rhythm: Tachycardia present.  Musculoskeletal:        General: Normal range of motion.     Cervical back: Normal range of motion.  Neurological:     General: No focal deficit present.     Mental Status: Vanessa BillCarolyn J. Marshman "[place] Sizernid" is alert and oriented to person, place, and  time.  Psychiatric:        Attention and Perception: Attention and perception normal.        Mood and Affect: Mood and affect normal.        Speech: Speech normal.        Behavior: Behavior normal. Behavior is cooperative.        Thought Content: Thought content normal.        Cognition and Memory: Cognition and memory normal.        Judgment: Judgment normal.     Review of Systems  Constitutional: Negative.  Negative for appetite change.  HENT: Negative.   Eyes: Negative.   Respiratory: Negative.   Gastrointestinal: Negative for constipation.  Endocrine: Negative.   Musculoskeletal: Negative.   Neurological: Negative.   Psychiatric/Behavioral: Negative.  Negative for dysphoric mood and sleep disturbance.    Blood pressure 94/64, pulse 86, temperature 98.6 F (37 C), temperature source Oral, resp. rate 16, height 5\' 7"  (1.702 m), weight 54 kg, SpO2 98 %.Body mass index is 18.64 kg/m.  General Appearance:  Casual  Eye Contact:  Good  Speech:  Normal Rate  Volume:  Normal  Mood:  Euthymic  Affect:  Appropriate  Thought Process:  Linear and Descriptions of Associations: Intact  Orientation:  Full (Time, Place, and Person)  Thought Content:  Logical  Suicidal Thoughts:  No  Homicidal Thoughts:  No  Memory:  Immediate;   Good Recent;   Good Remote;   Good  Judgement:  Fair  Insight:  Good  Psychomotor Activity:  Normal  Concentration:  Concentration: Good and Attention Span: Good  Recall:  Good  Fund of Knowledge:  Good  Language:  Good  Akathisia:  Negative  Handed:  Right  AIMS (if indicated):     Assets:  Communication Skills Desire for Improvement Financial Resources/Insurance Housing Physical Health Resilience Social Support Talents/Skills Vocational/Educational  ADL's:  Intact  Cognition:  WNL  Sleep:  Number of Hours: 6.75        Has this patient used any form of tobacco in the last 30 days? (Cigarettes, Smokeless Tobacco, Cigars, and/or Pipes) Yes,  N/A  Blood Alcohol level:  Lab Results  Component Value Date   ETH <10 06/14/2020    Metabolic Disorder Labs:  Lab Results  Component Value Date   HGBA1C 4.8 06/14/2020   MPG 91.06 06/14/2020   No results found for: PROLACTIN Lab Results  Component Value Date   CHOL 140 06/14/2020   TRIG 55 06/14/2020   HDL 58 06/14/2020   CHOLHDL 2.4 06/14/2020   VLDL 11 06/14/2020   LDLCALC 71 06/14/2020    See Psychiatric Specialty Exam and Suicide Risk Assessment completed by Attending Physician prior to discharge.  Discharge destination:  Home  Is patient on multiple antipsychotic therapies at discharge:  No   Has Patient had three or more failed trials of antipsychotic monotherapy by history:  No  Recommended Plan for Multiple Antipsychotic Therapies: NA  Discharge Instructions    Discharge patient   Complete by: As directed    Discharge disposition: 01-Home or Self Care   Discharge patient date: 06/16/2020     Allergies as of 06/16/2020      Reactions   Red Dye Hives   Lactose Intolerance (gi)    Nickel       Medication List    TAKE these medications     Indication  busPIRone 10 MG tablet Commonly known as: BUSPAR Take 1 tablet (10 mg total) by mouth 3 (three) times daily. What changed:   medication strength  how much to take  when to take this  Indication: Major Depressive Disorder   escitalopram 20 MG tablet Commonly known as: LEXAPRO Take 1 tablet (20 mg total) by mouth at bedtime. What changed:   medication strength  how much to take  Indication: Major Depressive Disorder   famotidine 20 MG tablet Commonly known as: PEPCID Take 20 mg by mouth 2 (two) times daily.  Indication: Gastroesophageal Reflux Disease   hydrOXYzine 25 MG tablet Commonly known as: ATARAX/VISTARIL Take 1 tablet (25 mg total) by mouth 3 (three) times daily as needed for anxiety.  Indication: Feeling Anxious   nicotine polacrilex 2 MG gum Commonly known as: NICORETTE Take  1 each (2 mg total) by mouth as needed for smoking cessation.  Indication: Nicotine Addiction       Follow-up Information    [facility]. Call on 06/18/2020.   Specialty: Behavioral Health Why: Please call to schedule an appointment for medication management and therapy. Walk in  hours are available Friday mornings.  Contact information: [phone] [place] [address] [phone]       [facility] Follow up.   Specialty: Professional Counselor Why: This provider offers medication management and outpatient therapy. New clients must use walk in hours M-F 8am-12:30pm for an initial assessment. Please bring photo ID, proof of insurance and income, and hospital discharge paperwork with you.  Contact information: [facility] [address] [place] [address] [phone]               Follow-up recommendations:  Activity:  Normal Diet:  Normal  Comments:  Prescriptions given at discharge. Patient agreeable to plan. Given opportunity to ask questions. Appears to feel comfortable with discharge denies any current suicidal or homicidal thought. Patient is also instructed prior to discharge to: Take all medications as prescribed by his/her mental healthcare provider. Report any adverse effects and or reactions from the medicines to her outpatient provider promptly. Patient has been instructed & cautioned: To not engage in alcohol and or illegal drug use while on prescription medicines. In the event of worsening symptoms, patient is instructed to call the crisis hotline, 911 and or go to the nearest ED for appropriate evaluation and treatment of symptoms. To follow-up with her primary care provider for your other medical issues, concerns and or health care needs.  Signed: Arnoldo Lenis, MD 06/16/2020, 5:35 PM

## 2021-04-11 ENCOUNTER — Encounter (HOSPITAL_COMMUNITY): Payer: Self-pay | Admitting: Emergency Medicine

## 2021-04-11 ENCOUNTER — Ambulatory Visit (HOSPITAL_COMMUNITY)
Admission: EM | Admit: 2021-04-11 | Discharge: 2021-04-11 | Disposition: A | Discharge status: Home / Self Care | Payer: Medicaid Other

## 2021-04-11 ENCOUNTER — Other Ambulatory Visit: Payer: Self-pay

## 2021-04-11 DIAGNOSIS — J029 Acute pharyngitis, unspecified: Secondary | ICD-10-CM

## 2021-04-11 DIAGNOSIS — J0140 Acute pansinusitis, unspecified: Secondary | ICD-10-CM

## 2021-04-11 DIAGNOSIS — R058 Other specified cough: Secondary | ICD-10-CM

## 2021-04-11 DIAGNOSIS — H66002 Acute suppurative otitis media without spontaneous rupture of ear drum, left ear: Secondary | ICD-10-CM

## 2021-04-11 MED ORDER — AMOXICILLIN-POT CLAVULANATE 875-125 MG PO TABS: 1.0000 | ORAL_TABLET | Freq: Two times a day (BID) | ORAL | 0 refills | Status: DC

## 2021-04-11 MED ORDER — ALBUTEROL SULFATE HFA 108 (90 BASE) MCG/ACT IN AERS: 1.0000 | INHALATION_SPRAY | Freq: Four times a day (QID) | RESPIRATORY_TRACT | 0 refills | Status: AC | PRN

## 2021-04-11 MED ORDER — FLUTICASONE PROPIONATE 50 MCG/ACT NA SUSP: 2.0000 | Freq: Every day | NASAL | 0 refills | Status: AC

## 2021-04-11 NOTE — ED Triage Notes (Signed)
PT reports congestion, cough, sore throat fro 2 weeks. She has has 3 negative covid tests.

## 2021-04-11 NOTE — ED Provider Notes (Signed)
[facility]    CSN: [id number] Arrival date & time: 04/11/21  1524      History   Chief Complaint Chief Complaint  Patient presents with   Sore Throat   Shortness of Breath    HPI Vanessa Henry is a 25 y.o. adult.   HPI  Sore Throat: Pt reports that for the past 2 weeks she has had sore throat, productive cough without hemoptysis, ear pain, nasal congestion and shortness of breath.  She thinks that shortness of breath likely is attributed to her asthma and anxiety and asked for an inhaler refill.  She reports no recent known fevers although she does have a low-grade fever today.  She reports that she has had 3 negative COVID-19 tests.  She has tried Mucinex for symptoms without major relief  Past Medical History:  Diagnosis Date   Asthma    UTI (lower urinary tract infection)     Patient Active Problem List   Diagnosis Date Noted   PTSD (post-traumatic stress disorder) 06/14/2020   MDD (major depressive disorder) 06/13/2020    History reviewed. No pertinent surgical history.  OB History   No obstetric history on file.      Home Medications    Prior to Admission medications   Medication Sig Start Date End Date Taking? Authorizing Provider  busPIRone (BUSPAR) 15 MG tablet Take 15 mg by mouth 2 (two) times daily.   Yes [provider]  diphenhydrAMINE-PE-APAP (THERAFLU EXPRESSMAX PO) Take by mouth.   Yes [provider]  escitalopram (LEXAPRO) 20 MG tablet Take 1 tablet (20 mg total) by mouth at bedtime. 06/16/20 04/11/21 Yes Dagar, Geralynn Rile, MD  famotidine (PEPCID) 20 MG tablet Take 20 mg by mouth 2 (two) times daily.   Yes [provider]  hydrOXYzine (ATARAX/VISTARIL) 25 MG tablet Take 1 tablet (25 mg total) by mouth 3 (three) times daily as needed for anxiety. 06/16/20   Dagar, Geralynn Rile, MD  nicotine polacrilex (NICORETTE) 2 MG gum Take 1 each (2 mg total) by mouth as needed for smoking cessation. 06/16/20   Dagar, Geralynn Rile, MD     Family History History reviewed. No pertinent family history.  Social History Social History   Tobacco Use   Smoking status: Every Day    Pack years: 0.00   Smokeless tobacco: Never  Vaping Use   Vaping Use: Every day  Substance Use Topics   Alcohol use: Yes   Drug use: No     Allergies   Red dye, Lactose intolerance (gi), and Nickel   Review of Systems Review of Systems As stated above in HPI  Physical Exam Triage Vital Signs ED Triage Vitals  Enc Vitals Group     BP 04/11/21 1630 114/75     Pulse Rate 04/11/21 1630 83     Resp 04/11/21 1630 16     Temp 04/11/21 1630 99.3 F (37.4 C)     Temp Source 04/11/21 1630 Oral     SpO2 04/11/21 1630 100 %     Weight --      Height --      Head Circumference --      Peak Flow --      Pain Score 04/11/21 1628 0     Pain Loc --      Pain Edu? --      Excl. in GC? --    No data found.  Updated Vital Signs BP 114/75   Pulse 83   Temp 99.3 F (  37.4 C) (Oral)   Resp 16   LMP 04/10/2021   SpO2 100%   Physical Exam Vitals and nursing note reviewed.  Constitutional:      General: Vanessa Bill. Bellanca "[name]" is not in acute distress.    Appearance: Vanessa Klemz. Chahal "[name]" is well-developed. Vanessa Bill. Freund "[name]" is not ill-appearing, toxic-appearing or diaphoretic.  HENT:     Head: Normocephalic and atraumatic.     Right Ear: Tympanic membrane normal. No middle ear effusion. Tympanic membrane is not erythematous.     Left Ear: A middle ear effusion is present. Tympanic membrane is erythematous.     Nose: Congestion present.     Comments: Maxillary and frontal sinus bogginess and tenderness    Mouth/Throat:     Mouth: Mucous membranes are moist.     Pharynx: Uvula midline. Posterior oropharyngeal erythema present. No oropharyngeal exudate or uvula swelling.     Tonsils: No tonsillar exudate or tonsillar abscesses.  Eyes:     Conjunctiva/sclera: Conjunctivae normal.     Pupils: Pupils are equal,  round, and reactive to light.  Cardiovascular:     Rate and Rhythm: Normal rate and regular rhythm.     Heart sounds: Normal heart sounds.  Pulmonary:     Effort: Pulmonary effort is normal.     Breath sounds: Normal breath sounds.  Musculoskeletal:     Cervical back: Normal range of motion and neck supple.  Lymphadenopathy:     Cervical: Cervical adenopathy present.  Skin:    General: Skin is warm.  Neurological:     Mental Status: Vanessa Harriss. Weider "[name]" is alert and oriented to person, place, and time.     UC Treatments / Results  Labs (all labs ordered are listed, but only abnormal results are displayed) Labs Reviewed - No data to display  EKG   Radiology No results found.  Procedures Procedures (including critical care time)  Medications Ordered in UC Medications - No data to display  Initial Impression / Assessment and Plan / UC Course  I have reviewed the triage vital signs and the nursing notes.  Pertinent labs & imaging results that were available during my care of the patient were reviewed by me and considered in my medical decision making (see chart for details).     New.  Working to treat her with Augmentin to help with her pansinusitis and acute Kinnie Scales media in addition this will cover for any potential pneumonia or strep pharyngitis.  Treating to help prevent further systemic illness.  Also sending in an albuterol refill and Flonase for patient.  Discussed hydration with water and rest.  Discussed red flag signs and symptoms. Final Clinical Impressions(s) / UC Diagnoses   Final diagnoses:  None   Discharge Instructions   None    ED Prescriptions   None    PDMP not reviewed this encounter.   Rushie Chestnut, [place] 04/11/21 1656

## 2021-10-22 DIAGNOSIS — R69 Illness, unspecified: Secondary | ICD-10-CM | POA: Diagnosis not present

## 2021-10-29 DIAGNOSIS — R69 Illness, unspecified: Secondary | ICD-10-CM | POA: Diagnosis not present

## 2021-11-05 DIAGNOSIS — R69 Illness, unspecified: Secondary | ICD-10-CM | POA: Diagnosis not present

## 2021-11-12 DIAGNOSIS — R69 Illness, unspecified: Secondary | ICD-10-CM | POA: Diagnosis not present

## 2021-11-19 DIAGNOSIS — R69 Illness, unspecified: Secondary | ICD-10-CM | POA: Diagnosis not present

## 2021-11-26 DIAGNOSIS — R69 Illness, unspecified: Secondary | ICD-10-CM | POA: Diagnosis not present

## 2021-12-03 DIAGNOSIS — R69 Illness, unspecified: Secondary | ICD-10-CM | POA: Diagnosis not present

## 2021-12-10 DIAGNOSIS — R69 Illness, unspecified: Secondary | ICD-10-CM | POA: Diagnosis not present

## 2021-12-20 DIAGNOSIS — R69 Illness, unspecified: Secondary | ICD-10-CM | POA: Diagnosis not present

## 2021-12-24 DIAGNOSIS — R69 Illness, unspecified: Secondary | ICD-10-CM | POA: Diagnosis not present

## 2021-12-31 DIAGNOSIS — R69 Illness, unspecified: Secondary | ICD-10-CM | POA: Diagnosis not present

## 2022-01-07 DIAGNOSIS — R69 Illness, unspecified: Secondary | ICD-10-CM | POA: Diagnosis not present

## 2022-01-19 ENCOUNTER — Other Ambulatory Visit: Payer: Self-pay

## 2022-01-19 ENCOUNTER — Emergency Department (HOSPITAL_BASED_OUTPATIENT_CLINIC_OR_DEPARTMENT_OTHER): Payer: 59 | Admitting: Radiology

## 2022-01-19 ENCOUNTER — Encounter (HOSPITAL_BASED_OUTPATIENT_CLINIC_OR_DEPARTMENT_OTHER): Payer: Self-pay

## 2022-01-19 ENCOUNTER — Emergency Department (HOSPITAL_BASED_OUTPATIENT_CLINIC_OR_DEPARTMENT_OTHER)
Admission: EM | Admit: 2022-01-19 | Discharge: 2022-01-19 | Disposition: A | Discharge status: Home / Self Care | Payer: 59 | Attending: Emergency Medicine | Admitting: Emergency Medicine

## 2022-01-19 DIAGNOSIS — S6991XA Unspecified injury of right wrist, hand and finger(s), initial encounter: Secondary | ICD-10-CM | POA: Diagnosis present

## 2022-01-19 DIAGNOSIS — S61401A Unspecified open wound of right hand, initial encounter: Secondary | ICD-10-CM | POA: Diagnosis not present

## 2022-01-19 DIAGNOSIS — T148XXA Other injury of unspecified body region, initial encounter: Secondary | ICD-10-CM

## 2022-01-19 DIAGNOSIS — S61011A Laceration without foreign body of right thumb without damage to nail, initial encounter: Secondary | ICD-10-CM | POA: Diagnosis not present

## 2022-01-19 DIAGNOSIS — Z79899 Other long term (current) drug therapy: Secondary | ICD-10-CM | POA: Insufficient documentation

## 2022-01-19 DIAGNOSIS — W540XXA Bitten by dog, initial encounter: Secondary | ICD-10-CM | POA: Insufficient documentation

## 2022-01-19 DIAGNOSIS — Z23 Encounter for immunization: Secondary | ICD-10-CM | POA: Diagnosis not present

## 2022-01-19 DIAGNOSIS — S61451A Open bite of right hand, initial encounter: Secondary | ICD-10-CM | POA: Diagnosis not present

## 2022-01-19 DIAGNOSIS — S61501A Unspecified open wound of right wrist, initial encounter: Secondary | ICD-10-CM | POA: Diagnosis not present

## 2022-01-19 MED ORDER — AMOXICILLIN-POT CLAVULANATE 875-125 MG PO TABS
1.0000 | ORAL_TABLET | Freq: Once | ORAL | Status: AC
  Administered 2022-01-19: 1 via ORAL
  Filled 2022-01-19: qty 1

## 2022-01-19 MED ORDER — RABIES IMMUNE GLOBULIN 150 UNIT/ML IM INJ
20.0000 [IU]/kg | INJECTION | Freq: Once | INTRAMUSCULAR | Status: AC
  Administered 2022-01-19: 975 [IU] via INTRAMUSCULAR
  Filled 2022-01-19: qty 2

## 2022-01-19 MED ORDER — TETANUS-DIPHTH-ACELL PERTUSSIS 5-2.5-18.5 LF-MCG/0.5 IM SUSY
0.5000 mL | PREFILLED_SYRINGE | Freq: Once | INTRAMUSCULAR | Status: AC
  Administered 2022-01-19: 0.5 mL via INTRAMUSCULAR
  Filled 2022-01-19: qty 0.5

## 2022-01-19 MED ORDER — RABIES VACCINE, PCEC IM SUSR
1.0000 mL | Freq: Once | INTRAMUSCULAR | Status: AC
  Administered 2022-01-19: 1 mL via INTRAMUSCULAR
  Filled 2022-01-19: qty 1

## 2022-01-19 MED ORDER — AMOXICILLIN-POT CLAVULANATE 875-125 MG PO TABS: 1.0000 | ORAL_TABLET | Freq: Two times a day (BID) | ORAL | 0 refills | Status: AC

## 2022-01-19 NOTE — ED Triage Notes (Signed)
Patient here POV from Home. ? ?Patient endorses being Bite by a [place] approximately 2-3 hours PTA. Owner of Dog endorses Dog is not vaccinated for Rabies. ? ?Tetanus is not UTD. Small Puncture Wound to Right Hand and Small Superficial Laceration to Right Wrist.  ? ?NAD Noted during Triage. A&Ox4. GCS 15. Ambulatory.  ?

## 2022-01-19 NOTE — Discharge Instructions (Signed)
Take Augmentin twice daily for 10 days.  ? ?                                RABIES VACCINE FOLLOW UP ? ?Patient's Name: Vanessa Henry. Mccrone                     Original Order Date:01/19/2022 ? ?Medical Record Number: [id number]  ED Physician: Cheryll Cockayne, MD ?Primary Diagnosis: Rabies Exposure       PCP: Irena Reichmann, DO ? ?Patient Phone Number: (home) [phone] (home)    (cell)  ?Telephone Information:  ?Mobile [phone]  ? ? 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 ?Species of Animal: Dog ? ? ?You have been seen in the Emergency Department for a possible rabies exposure. It's very important you return for the additional vaccine doses.  Please call the clinic listed below for hours of operation. ? ? ?Clinic that will administer your rabies vaccines: ?[facility] - [address] [place], [place] [address]  [phone] ? ?DAY 0:  01/19/2022     ? ?DAY 3:  01/22/2022      ? ?DAY 7:  01/26/2022    ? ?DAY 14:  02/02/2022       ? ? ?The 5th vaccine injection is considered for immune compromised patients only. ? ?DAY 28:  02/16/2022     ? ?

## 2022-01-19 NOTE — ED Provider Notes (Signed)
?[facility]PT ?Provider Note ? ? ?CSN: [id number] ?Arrival date & time: 01/19/22  1548 ? ?  ? ?History ? ?Chief Complaint  ?Patient presents with  ? Animal Bite  ? ? ?Bartholome Bill. Skilton is a 26 y.o. adult. ? ? ?Animal Bite ? ?Patient presents with animal bite to right hand.  It was a Twyla, the animals owner states the animal is not vaccinated.  Patient's pain is to the dorsum of her right hand as well as her right wrist, she able to move it without any difficulties.  Her tetanus is not up-to-date, and no paresthesias.  No pain elsewhere. ? ?Home Medications ?Prior to Admission medications   ?Medication Sig Start Date End Date Taking? Authorizing Provider  ?amoxicillin-clavulanate (AUGMENTIN) 875-125 MG tablet Take 1 tablet by mouth every 12 (twelve) hours. 01/19/22  Yes Theron Arista, PA-C  ?albuterol (VENTOLIN HFA) 108 (90 Base) MCG/ACT inhaler Inhale 1-2 puffs into the lungs every 6 (six) hours as needed for wheezing or shortness of breath. 04/11/21   Rushie Chestnut, PA-C  ?busPIRone (BUSPAR) 15 MG tablet Take 15 mg by mouth 2 (two) times daily.    [provider]  ?diphenhydrAMINE-PE-APAP (THERAFLU EXPRESSMAX PO) Take by mouth.    [provider]  ?escitalopram (LEXAPRO) 20 MG tablet Take 1 tablet (20 mg total) by mouth at bedtime. 06/16/20 04/11/21  Dagar, Geralynn Rile, MD  ?famotidine (PEPCID) 20 MG tablet Take 20 mg by mouth 2 (two) times daily.    [provider]  ?fluticasone (FLONASE) 50 MCG/ACT nasal spray Place 2 sprays into both nostrils daily. 04/11/21   Rushie Chestnut, PA-C  ?hydrOXYzine (ATARAX/VISTARIL) 25 MG tablet Take 1 tablet (25 mg total) by mouth 3 (three) times daily as needed for anxiety. 06/16/20   Dagar, Geralynn Rile, MD  ?nicotine polacrilex (NICORETTE) 2 MG gum Take 1 each (2 mg total) by mouth as needed for smoking cessation. 06/16/20   Dagar, Geralynn Rile, MD  ?   ? ?Allergies    ?Red dye, Lactose intolerance (gi), and Nickel   ? ?Review of Systems    ?Review of Systems ? ?Physical Exam ?Updated Vital Signs ?BP 112/62 (BP Location: Right Arm)   Pulse 70   Temp 98.5 ?F (36.9 ?C) (Oral)   Resp 15   Ht 5\' 8"  (1.727 m)   Wt 49.9 kg   SpO2 100%   BMI 16.73 kg/m?  ?Physical Exam ?Vitals and nursing note reviewed. Exam conducted with a chaperone present.  ?Constitutional:   ?   General: . Scullion "Bartholome Bill" is not in acute distress. ?   Appearance: Normal appearance.  ?HENT:  ?   Head: Normocephalic and atraumatic.  ?Eyes:  ?   General: No scleral icterus. ?   Extraocular Movements: Extraocular movements intact.  ?   Pupils: Pupils are equal, round, and reactive to light.  ?Cardiovascular:  ?   Pulses: Normal pulses.  ?Musculoskeletal:     ?   General: Tenderness present. Normal range of motion.  ?   Comments: Superficial laceration to right thumb dorsum, puncture wounds noted.  ?Skin: ?   Capillary Refill: Capillary refill takes less than 2 seconds.  ?   Coloration: Skin is not jaundiced.  ?Neurological:  ?   Mental Status: Nusrat Encarnacion. Fennelly "Bartholome Bill" is alert. Mental status is at baseline.  ?   Coordination: Coordination normal.  ? ? ?ED Results / Procedures / Treatments   ?Labs ?(all labs ordered are listed, but only abnormal results are  displayed) ?Labs Reviewed - No data to display ? ?EKG ?None ? ?Radiology ?DG Wrist Complete Right ? ?Result Date: 01/19/2022 ?CLINICAL DATA:  Animal bite EXAM: RIGHT WRIST - COMPLETE 3+ VIEW COMPARISON:  None. FINDINGS: There is no evidence of fracture or dislocation. There is no evidence of arthropathy or other focal bone abnormality. Soft tissues are unremarkable. IMPRESSION: Negative. Electronically Signed   By: Jasmine Pang M.D.   On: 01/19/2022 19:00  ? ?DG Hand Complete Right ? ?Result Date: 01/19/2022 ?CLINICAL DATA:  Status post dog bite. EXAM: RIGHT HAND - COMPLETE 3+ VIEW COMPARISON:  None. FINDINGS: There is no evidence of fracture or dislocation. There is no evidence of arthropathy or other focal bone  abnormality. Soft tissues are unremarkable. IMPRESSION: Negative. Electronically Signed   By: Aram Candela M.D.   On: 01/19/2022 19:00   ? ?Procedures ?Procedures  ? ? ?Medications Ordered in ED ?Medications  ?rabies immune globulin (HYPERAB/KEDRAB) injection 975 Units (975 Units Intramuscular Given 01/19/22 1936)  ?rabies vaccine (RABAVERT) injection 1 mL (1 mL Intramuscular Given 01/19/22 1934)  ?amoxicillin-clavulanate (AUGMENTIN) 875-125 MG per tablet 1 tablet (1 tablet Oral Given 01/19/22 1939)  ?Tdap (BOOSTRIX) injection 0.5 mL (0.5 mLs Intramuscular Given 01/19/22 1933)  ? ? ?ED Course/ Medical Decision Making/ A&P ?  ?                        ?Medical Decision Making ?Amount and/or Complexity of Data Reviewed ?Radiology: ordered. ? ?Risk ?Prescription drug management. ? ? ?This patient presents to the ED for concern of animal bite, this involves an extensive number of treatment options, and is a complaint that carries with it a high risk of complications and morbidity.  The differential diagnosis includes laceration, infection, retained FOB, rabies ? ?Additional history obtained:  ? ?Independent historian: partner ? ?  ?Imaging Studies ordered: ? ?I directly visualized the xrays wrist, hand, which showed no retained FOB ? ?I agree with the radiologist interpretation ? ? ? ?Medicines ordered and prescription drug management: ? ?I ordered medication including: augmentin, rabies vaccination    ? ?I have reviewed the patients home medicines and have made adjustments as needed ? ?Reevaluation: ? ?After the interventions noted above, I reevaluated the patient and found unchanged ? ? ?Problems addressed / ED Course: ?Animal bite-patient is neurovascularly intact with brisk cap refill, radial pulses 2+.  Full ROM, superficial lacerations noted.  Wounds were cleaned, rabies vaccination series given and Augmentin prescribed.  Patient discharged in stable condition. ?  ?Social Determinants of Health: ?Young, no  co-morbidities ?  ?Disposition: ? ? ?After consideration of the diagnostic results and the patients response to treatment, I feel that the patent would benefit from D/C. ? ?  ? ? ? ? ? ? ? ? ?Final Clinical Impression(s) / ED Diagnoses ?Final diagnoses:  ?Animal bite  ? ? ?Rx / DC Orders ?ED Discharge Orders   ? ?      Ordered  ?  amoxicillin-clavulanate (AUGMENTIN) 875-125 MG tablet  Every 12 hours       ? 01/19/22 1940  ? ?  ?  ? ?  ? ? ?  ?Theron Arista, PA-C ?01/19/22 2032 ? ?  ?Cheryll Cockayne, MD ?01/19/22 2326 ? ?

## 2022-01-21 DIAGNOSIS — R69 Illness, unspecified: Secondary | ICD-10-CM | POA: Diagnosis not present

## 2022-01-22 ENCOUNTER — Ambulatory Visit (HOSPITAL_COMMUNITY)
Admission: EM | Admit: 2022-01-22 | Discharge: 2022-01-22 | Disposition: A | Discharge status: Home / Self Care | Payer: 59

## 2022-01-22 DIAGNOSIS — Z203 Contact with and (suspected) exposure to rabies: Secondary | ICD-10-CM | POA: Diagnosis not present

## 2022-01-22 DIAGNOSIS — Z2914 Encounter for prophylactic rabies immune globin: Secondary | ICD-10-CM | POA: Diagnosis not present

## 2022-01-26 DIAGNOSIS — Z203 Contact with and (suspected) exposure to rabies: Secondary | ICD-10-CM | POA: Diagnosis not present

## 2022-01-26 DIAGNOSIS — Z2914 Encounter for prophylactic rabies immune globin: Secondary | ICD-10-CM | POA: Diagnosis not present

## 2022-01-28 DIAGNOSIS — R69 Illness, unspecified: Secondary | ICD-10-CM | POA: Diagnosis not present

## 2022-02-02 DIAGNOSIS — Z2914 Encounter for prophylactic rabies immune globin: Secondary | ICD-10-CM | POA: Diagnosis not present

## 2022-02-02 DIAGNOSIS — Z203 Contact with and (suspected) exposure to rabies: Secondary | ICD-10-CM | POA: Diagnosis not present

## 2022-02-03 DIAGNOSIS — R69 Illness, unspecified: Secondary | ICD-10-CM | POA: Diagnosis not present

## 2022-02-05 DIAGNOSIS — R69 Illness, unspecified: Secondary | ICD-10-CM | POA: Diagnosis not present

## 2022-02-05 DIAGNOSIS — Z113 Encounter for screening for infections with a predominantly sexual mode of transmission: Secondary | ICD-10-CM | POA: Diagnosis not present

## 2022-02-05 DIAGNOSIS — N390 Urinary tract infection, site not specified: Secondary | ICD-10-CM | POA: Diagnosis not present

## 2022-02-05 DIAGNOSIS — Z681 Body mass index (BMI) 19 or less, adult: Secondary | ICD-10-CM | POA: Diagnosis not present

## 2022-02-05 DIAGNOSIS — Z309 Encounter for contraceptive management, unspecified: Secondary | ICD-10-CM | POA: Diagnosis not present

## 2022-02-05 DIAGNOSIS — Z01419 Encounter for gynecological examination (general) (routine) without abnormal findings: Secondary | ICD-10-CM | POA: Diagnosis not present

## 2022-02-11 DIAGNOSIS — R69 Illness, unspecified: Secondary | ICD-10-CM | POA: Diagnosis not present

## 2022-02-18 DIAGNOSIS — R69 Illness, unspecified: Secondary | ICD-10-CM | POA: Diagnosis not present

## 2022-02-25 DIAGNOSIS — R69 Illness, unspecified: Secondary | ICD-10-CM | POA: Diagnosis not present

## 2022-03-11 DIAGNOSIS — R69 Illness, unspecified: Secondary | ICD-10-CM | POA: Diagnosis not present

## 2022-03-17 DIAGNOSIS — Z760 Encounter for issue of repeat prescription: Secondary | ICD-10-CM | POA: Diagnosis not present

## 2022-03-17 DIAGNOSIS — R69 Illness, unspecified: Secondary | ICD-10-CM | POA: Diagnosis not present

## 2022-03-18 DIAGNOSIS — R69 Illness, unspecified: Secondary | ICD-10-CM | POA: Diagnosis not present

## 2022-03-25 DIAGNOSIS — R69 Illness, unspecified: Secondary | ICD-10-CM | POA: Diagnosis not present

## 2022-04-08 DIAGNOSIS — R69 Illness, unspecified: Secondary | ICD-10-CM | POA: Diagnosis not present

## 2022-04-09 DIAGNOSIS — Z13220 Encounter for screening for lipoid disorders: Secondary | ICD-10-CM | POA: Diagnosis not present

## 2022-04-09 DIAGNOSIS — R7309 Other abnormal glucose: Secondary | ICD-10-CM | POA: Diagnosis not present

## 2022-04-09 DIAGNOSIS — R946 Abnormal results of thyroid function studies: Secondary | ICD-10-CM | POA: Diagnosis not present

## 2022-04-15 DIAGNOSIS — R69 Illness, unspecified: Secondary | ICD-10-CM | POA: Diagnosis not present

## 2022-04-15 DIAGNOSIS — D229 Melanocytic nevi, unspecified: Secondary | ICD-10-CM | POA: Diagnosis not present

## 2022-04-15 DIAGNOSIS — F329 Major depressive disorder, single episode, unspecified: Secondary | ICD-10-CM | POA: Diagnosis not present

## 2022-04-15 DIAGNOSIS — Z0000 Encounter for general adult medical examination without abnormal findings: Secondary | ICD-10-CM | POA: Diagnosis not present

## 2022-04-15 DIAGNOSIS — F419 Anxiety disorder, unspecified: Secondary | ICD-10-CM | POA: Diagnosis not present

## 2022-04-29 DIAGNOSIS — R69 Illness, unspecified: Secondary | ICD-10-CM | POA: Diagnosis not present

## 2022-05-06 DIAGNOSIS — R69 Illness, unspecified: Secondary | ICD-10-CM | POA: Diagnosis not present

## 2022-05-11 DIAGNOSIS — R69 Illness, unspecified: Secondary | ICD-10-CM | POA: Diagnosis not present

## 2022-05-13 DIAGNOSIS — R69 Illness, unspecified: Secondary | ICD-10-CM | POA: Diagnosis not present

## 2022-05-20 DIAGNOSIS — R69 Illness, unspecified: Secondary | ICD-10-CM | POA: Diagnosis not present

## 2022-06-03 DIAGNOSIS — J3489 Other specified disorders of nose and nasal sinuses: Secondary | ICD-10-CM | POA: Diagnosis not present

## 2022-06-03 DIAGNOSIS — R142 Eructation: Secondary | ICD-10-CM | POA: Diagnosis not present

## 2022-06-03 DIAGNOSIS — K219 Gastro-esophageal reflux disease without esophagitis: Secondary | ICD-10-CM | POA: Diagnosis not present

## 2022-06-03 DIAGNOSIS — J342 Deviated nasal septum: Secondary | ICD-10-CM | POA: Diagnosis not present

## 2022-06-03 DIAGNOSIS — R69 Illness, unspecified: Secondary | ICD-10-CM | POA: Diagnosis not present

## 2022-06-10 DIAGNOSIS — R69 Illness, unspecified: Secondary | ICD-10-CM | POA: Diagnosis not present

## 2022-06-17 DIAGNOSIS — R69 Illness, unspecified: Secondary | ICD-10-CM | POA: Diagnosis not present

## 2022-07-01 DIAGNOSIS — R69 Illness, unspecified: Secondary | ICD-10-CM | POA: Diagnosis not present

## 2022-07-08 DIAGNOSIS — R69 Illness, unspecified: Secondary | ICD-10-CM | POA: Diagnosis not present

## 2022-07-22 DIAGNOSIS — R69 Illness, unspecified: Secondary | ICD-10-CM | POA: Diagnosis not present

## 2022-07-29 DIAGNOSIS — R69 Illness, unspecified: Secondary | ICD-10-CM | POA: Diagnosis not present

## 2022-08-04 DIAGNOSIS — K219 Gastro-esophageal reflux disease without esophagitis: Secondary | ICD-10-CM | POA: Diagnosis not present

## 2022-08-04 DIAGNOSIS — R142 Eructation: Secondary | ICD-10-CM | POA: Diagnosis not present

## 2022-08-05 ENCOUNTER — Other Ambulatory Visit: Payer: Self-pay | Admitting: Otolaryngology

## 2022-08-05 DIAGNOSIS — R142 Eructation: Secondary | ICD-10-CM

## 2022-08-05 DIAGNOSIS — K219 Gastro-esophageal reflux disease without esophagitis: Secondary | ICD-10-CM

## 2022-08-19 DIAGNOSIS — R69 Illness, unspecified: Secondary | ICD-10-CM | POA: Diagnosis not present

## 2022-08-27 DIAGNOSIS — R69 Illness, unspecified: Secondary | ICD-10-CM | POA: Diagnosis not present

## 2022-09-04 DIAGNOSIS — R69 Illness, unspecified: Secondary | ICD-10-CM | POA: Diagnosis not present

## 2022-09-10 DIAGNOSIS — R69 Illness, unspecified: Secondary | ICD-10-CM | POA: Diagnosis not present

## 2022-09-17 DIAGNOSIS — R69 Illness, unspecified: Secondary | ICD-10-CM | POA: Diagnosis not present

## 2022-09-24 DIAGNOSIS — R69 Illness, unspecified: Secondary | ICD-10-CM | POA: Diagnosis not present

## 2022-10-04 DIAGNOSIS — M791 Myalgia, unspecified site: Secondary | ICD-10-CM | POA: Diagnosis not present

## 2022-10-04 DIAGNOSIS — R051 Acute cough: Secondary | ICD-10-CM | POA: Diagnosis not present

## 2022-10-04 DIAGNOSIS — J029 Acute pharyngitis, unspecified: Secondary | ICD-10-CM | POA: Diagnosis not present

## 2022-10-04 DIAGNOSIS — Z681 Body mass index (BMI) 19 or less, adult: Secondary | ICD-10-CM | POA: Diagnosis not present

## 2022-10-15 DIAGNOSIS — D485 Neoplasm of uncertain behavior of skin: Secondary | ICD-10-CM | POA: Diagnosis not present

## 2022-10-15 DIAGNOSIS — L814 Other melanin hyperpigmentation: Secondary | ICD-10-CM | POA: Diagnosis not present

## 2022-10-15 DIAGNOSIS — D229 Melanocytic nevi, unspecified: Secondary | ICD-10-CM | POA: Diagnosis not present

## 2022-10-15 DIAGNOSIS — L298 Other pruritus: Secondary | ICD-10-CM | POA: Diagnosis not present

## 2022-10-15 DIAGNOSIS — D225 Melanocytic nevi of trunk: Secondary | ICD-10-CM | POA: Diagnosis not present

## 2022-10-15 DIAGNOSIS — L209 Atopic dermatitis, unspecified: Secondary | ICD-10-CM | POA: Diagnosis not present

## 2022-10-15 DIAGNOSIS — L821 Other seborrheic keratosis: Secondary | ICD-10-CM | POA: Diagnosis not present

## 2022-10-15 DIAGNOSIS — L309 Dermatitis, unspecified: Secondary | ICD-10-CM | POA: Diagnosis not present

## 2022-11-04 DIAGNOSIS — R69 Illness, unspecified: Secondary | ICD-10-CM | POA: Diagnosis not present

## 2022-11-11 DIAGNOSIS — R69 Illness, unspecified: Secondary | ICD-10-CM | POA: Diagnosis not present

## 2022-11-18 DIAGNOSIS — R69 Illness, unspecified: Secondary | ICD-10-CM | POA: Diagnosis not present

## 2022-11-25 DIAGNOSIS — R69 Illness, unspecified: Secondary | ICD-10-CM | POA: Diagnosis not present

## 2022-12-09 DIAGNOSIS — F411 Generalized anxiety disorder: Secondary | ICD-10-CM | POA: Diagnosis not present

## 2023-01-06 DIAGNOSIS — F411 Generalized anxiety disorder: Secondary | ICD-10-CM | POA: Diagnosis not present

## 2023-01-13 DIAGNOSIS — F411 Generalized anxiety disorder: Secondary | ICD-10-CM | POA: Diagnosis not present

## 2023-01-20 DIAGNOSIS — F411 Generalized anxiety disorder: Secondary | ICD-10-CM | POA: Diagnosis not present

## 2023-01-27 DIAGNOSIS — F411 Generalized anxiety disorder: Secondary | ICD-10-CM | POA: Diagnosis not present

## 2023-02-03 DIAGNOSIS — F411 Generalized anxiety disorder: Secondary | ICD-10-CM | POA: Diagnosis not present

## 2023-02-10 DIAGNOSIS — F411 Generalized anxiety disorder: Secondary | ICD-10-CM | POA: Diagnosis not present

## 2023-02-17 DIAGNOSIS — F411 Generalized anxiety disorder: Secondary | ICD-10-CM | POA: Diagnosis not present

## 2023-03-03 DIAGNOSIS — F411 Generalized anxiety disorder: Secondary | ICD-10-CM | POA: Diagnosis not present

## 2023-03-10 DIAGNOSIS — F411 Generalized anxiety disorder: Secondary | ICD-10-CM | POA: Diagnosis not present

## 2023-03-17 DIAGNOSIS — F411 Generalized anxiety disorder: Secondary | ICD-10-CM | POA: Diagnosis not present

## 2023-03-24 DIAGNOSIS — F411 Generalized anxiety disorder: Secondary | ICD-10-CM | POA: Diagnosis not present

## 2023-03-31 DIAGNOSIS — F411 Generalized anxiety disorder: Secondary | ICD-10-CM | POA: Diagnosis not present

## 2023-04-14 DIAGNOSIS — Z79899 Other long term (current) drug therapy: Secondary | ICD-10-CM | POA: Diagnosis not present

## 2023-04-14 DIAGNOSIS — R7309 Other abnormal glucose: Secondary | ICD-10-CM | POA: Diagnosis not present

## 2023-04-14 DIAGNOSIS — R946 Abnormal results of thyroid function studies: Secondary | ICD-10-CM | POA: Diagnosis not present

## 2023-04-14 DIAGNOSIS — E559 Vitamin D deficiency, unspecified: Secondary | ICD-10-CM | POA: Diagnosis not present

## 2023-04-14 DIAGNOSIS — K219 Gastro-esophageal reflux disease without esophagitis: Secondary | ICD-10-CM | POA: Diagnosis not present

## 2023-04-14 DIAGNOSIS — F411 Generalized anxiety disorder: Secondary | ICD-10-CM | POA: Diagnosis not present

## 2023-04-14 DIAGNOSIS — Z13220 Encounter for screening for lipoid disorders: Secondary | ICD-10-CM | POA: Diagnosis not present

## 2023-04-21 DIAGNOSIS — Z0000 Encounter for general adult medical examination without abnormal findings: Secondary | ICD-10-CM | POA: Diagnosis not present

## 2023-04-21 DIAGNOSIS — Z79899 Other long term (current) drug therapy: Secondary | ICD-10-CM | POA: Diagnosis not present

## 2023-04-21 DIAGNOSIS — F411 Generalized anxiety disorder: Secondary | ICD-10-CM | POA: Diagnosis not present

## 2023-04-21 DIAGNOSIS — R946 Abnormal results of thyroid function studies: Secondary | ICD-10-CM | POA: Diagnosis not present

## 2023-04-21 DIAGNOSIS — Z13220 Encounter for screening for lipoid disorders: Secondary | ICD-10-CM | POA: Diagnosis not present

## 2023-04-21 DIAGNOSIS — K219 Gastro-esophageal reflux disease without esophagitis: Secondary | ICD-10-CM | POA: Diagnosis not present

## 2023-04-21 DIAGNOSIS — R7309 Other abnormal glucose: Secondary | ICD-10-CM | POA: Diagnosis not present

## 2023-04-21 DIAGNOSIS — E559 Vitamin D deficiency, unspecified: Secondary | ICD-10-CM | POA: Diagnosis not present

## 2023-04-21 DIAGNOSIS — R5382 Chronic fatigue, unspecified: Secondary | ICD-10-CM | POA: Diagnosis not present

## 2023-04-26 DIAGNOSIS — F3281 Premenstrual dysphoric disorder: Secondary | ICD-10-CM | POA: Diagnosis not present

## 2023-04-26 DIAGNOSIS — Z6823 Body mass index (BMI) 23.0-23.9, adult: Secondary | ICD-10-CM | POA: Diagnosis not present

## 2023-04-26 DIAGNOSIS — Z113 Encounter for screening for infections with a predominantly sexual mode of transmission: Secondary | ICD-10-CM | POA: Diagnosis not present

## 2023-04-26 DIAGNOSIS — N390 Urinary tract infection, site not specified: Secondary | ICD-10-CM | POA: Diagnosis not present

## 2023-04-26 DIAGNOSIS — Z01419 Encounter for gynecological examination (general) (routine) without abnormal findings: Secondary | ICD-10-CM | POA: Diagnosis not present

## 2023-04-28 DIAGNOSIS — F411 Generalized anxiety disorder: Secondary | ICD-10-CM | POA: Diagnosis not present

## 2023-05-05 DIAGNOSIS — F411 Generalized anxiety disorder: Secondary | ICD-10-CM | POA: Diagnosis not present

## 2023-05-12 DIAGNOSIS — F411 Generalized anxiety disorder: Secondary | ICD-10-CM | POA: Diagnosis not present

## 2023-05-19 DIAGNOSIS — F411 Generalized anxiety disorder: Secondary | ICD-10-CM | POA: Diagnosis not present

## 2023-06-02 DIAGNOSIS — F411 Generalized anxiety disorder: Secondary | ICD-10-CM | POA: Diagnosis not present

## 2023-06-09 DIAGNOSIS — F411 Generalized anxiety disorder: Secondary | ICD-10-CM | POA: Diagnosis not present

## 2023-06-16 DIAGNOSIS — F411 Generalized anxiety disorder: Secondary | ICD-10-CM | POA: Diagnosis not present

## 2023-06-23 DIAGNOSIS — F411 Generalized anxiety disorder: Secondary | ICD-10-CM | POA: Diagnosis not present

## 2023-06-23 IMAGING — DX DG WRIST COMPLETE 3+V*R*
4 series · 4 of 4 positions shown · non-contrast
Comparison: None.

CLINICAL DATA: Animal bite

EXAM:
RIGHT WRIST - COMPLETE 3+ VIEW

[wrist ap (1 of 2)]
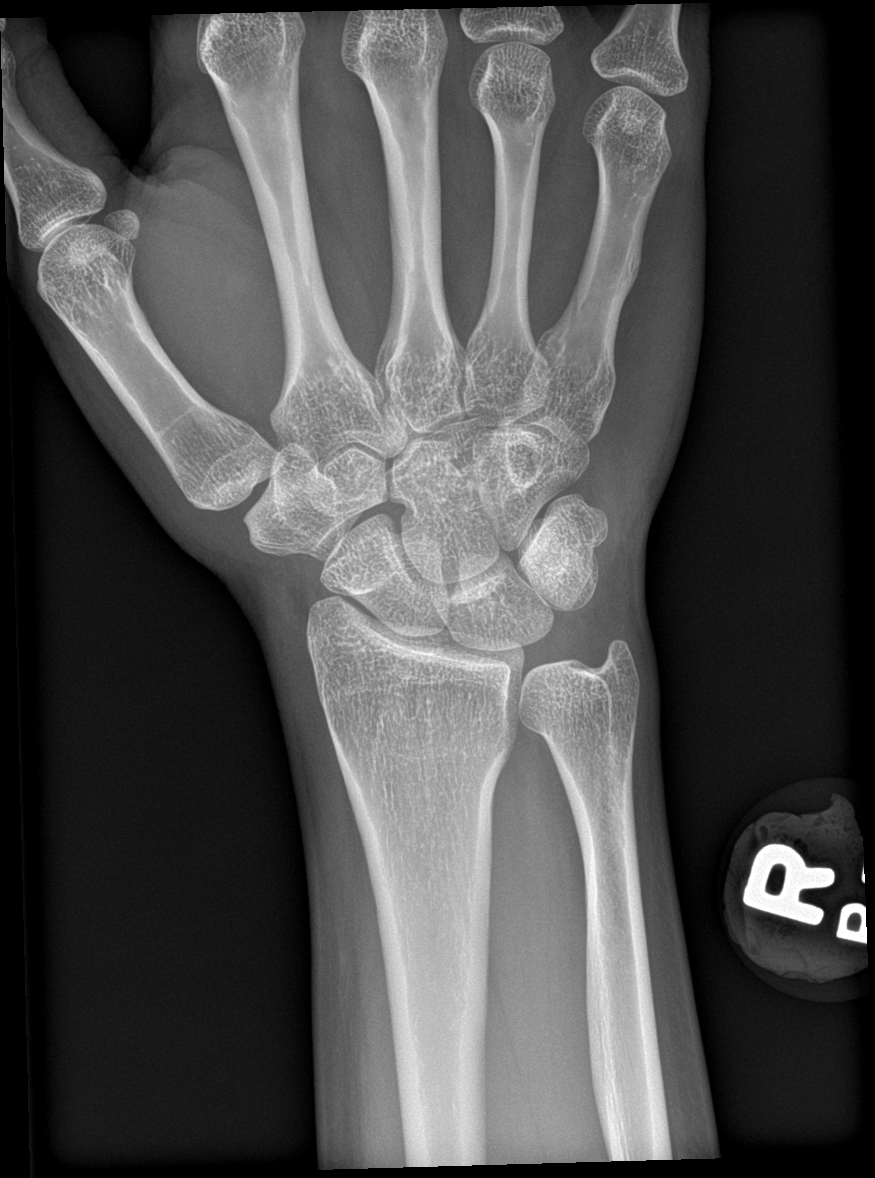

[wrist obl]
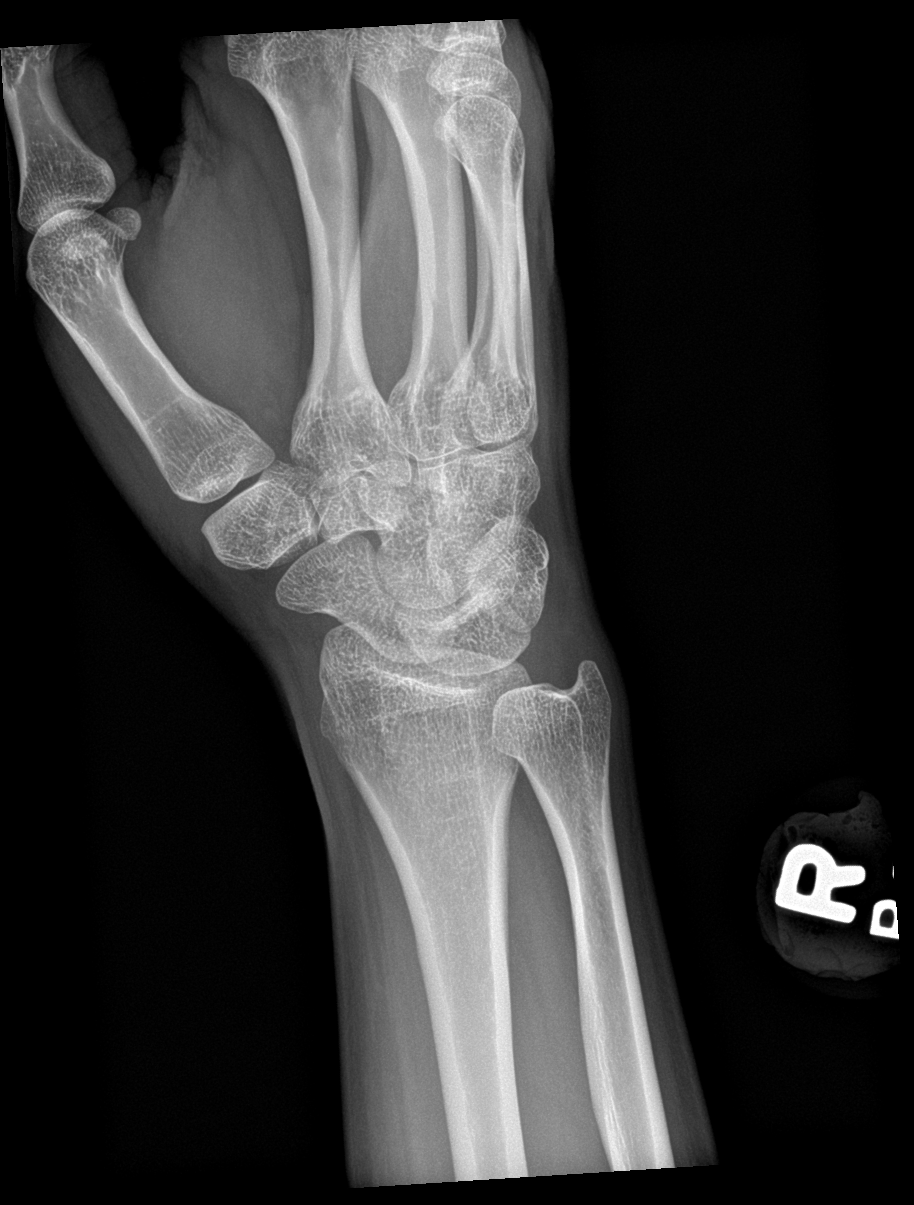

[wrist lat]
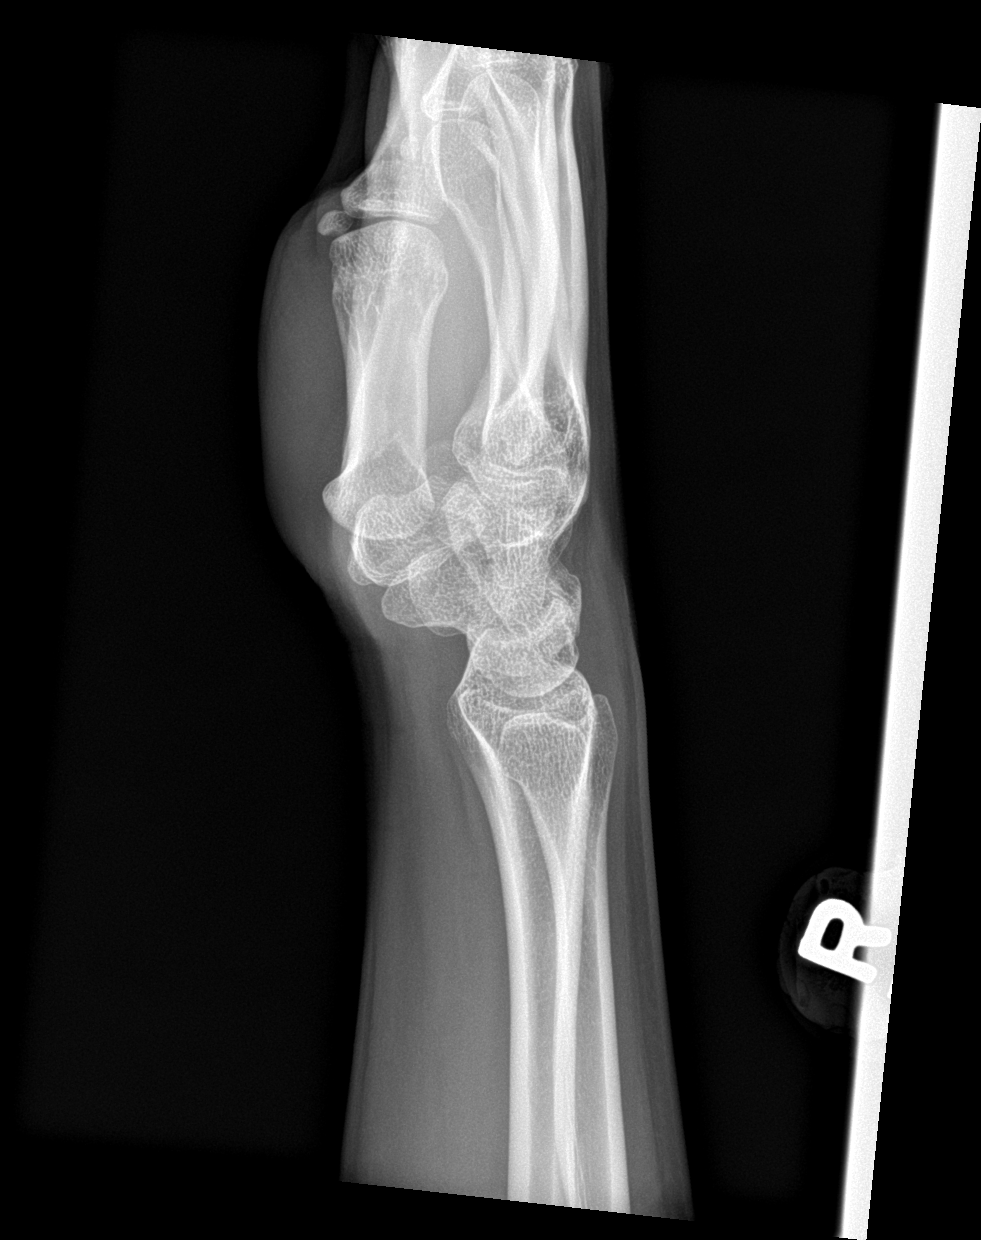

[wrist ap (2 of 2)]
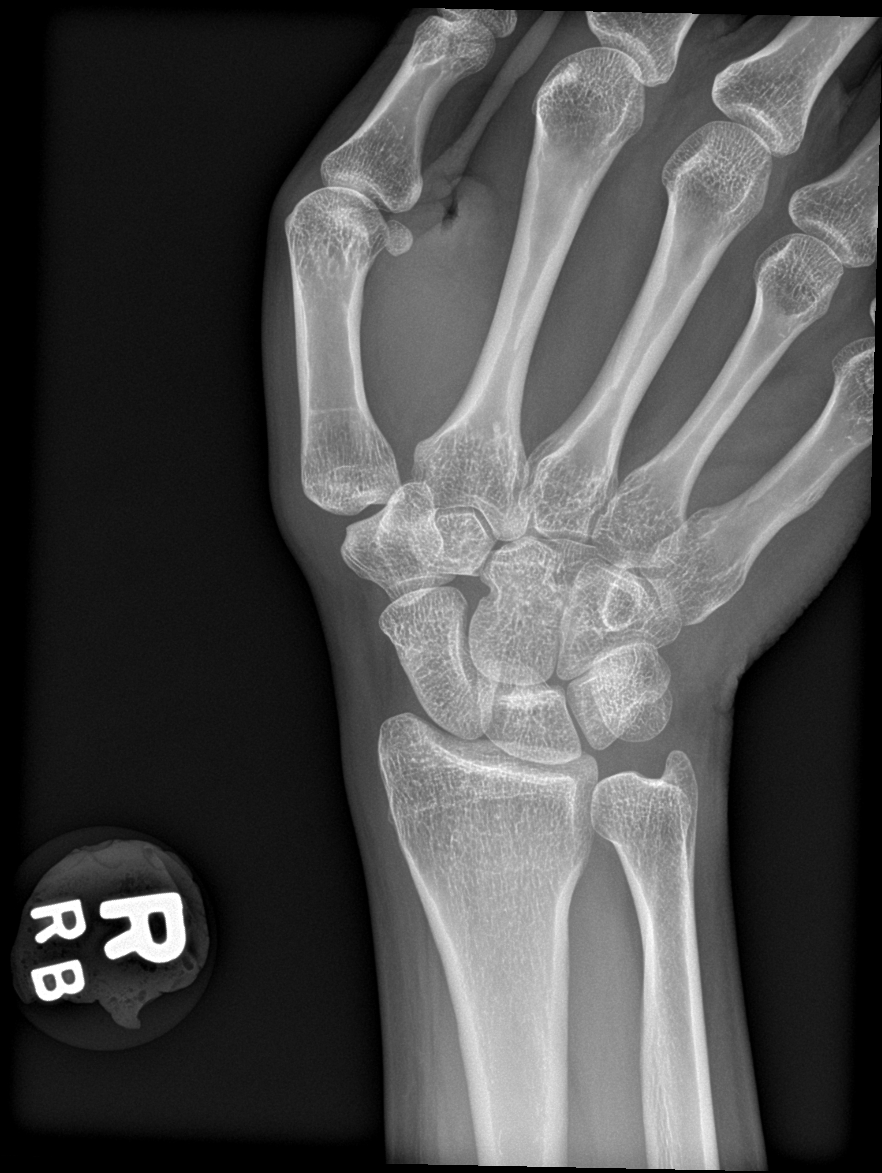

[4 of 4 positions shown; findings below may reference images not displayed]

FINDINGS: There is no evidence of fracture or dislocation. There is no
evidence of arthropathy or other focal bone abnormality. Soft
tissues are unremarkable.
IMPRESSION: Negative.

## 2023-06-23 IMAGING — DX DG HAND COMPLETE 3+V*R*
3 series · 3 of 3 positions shown · non-contrast
Comparison: None.

CLINICAL DATA: Status post dog bite.

EXAM:
RIGHT HAND - COMPLETE 3+ VIEW

[hand ap]
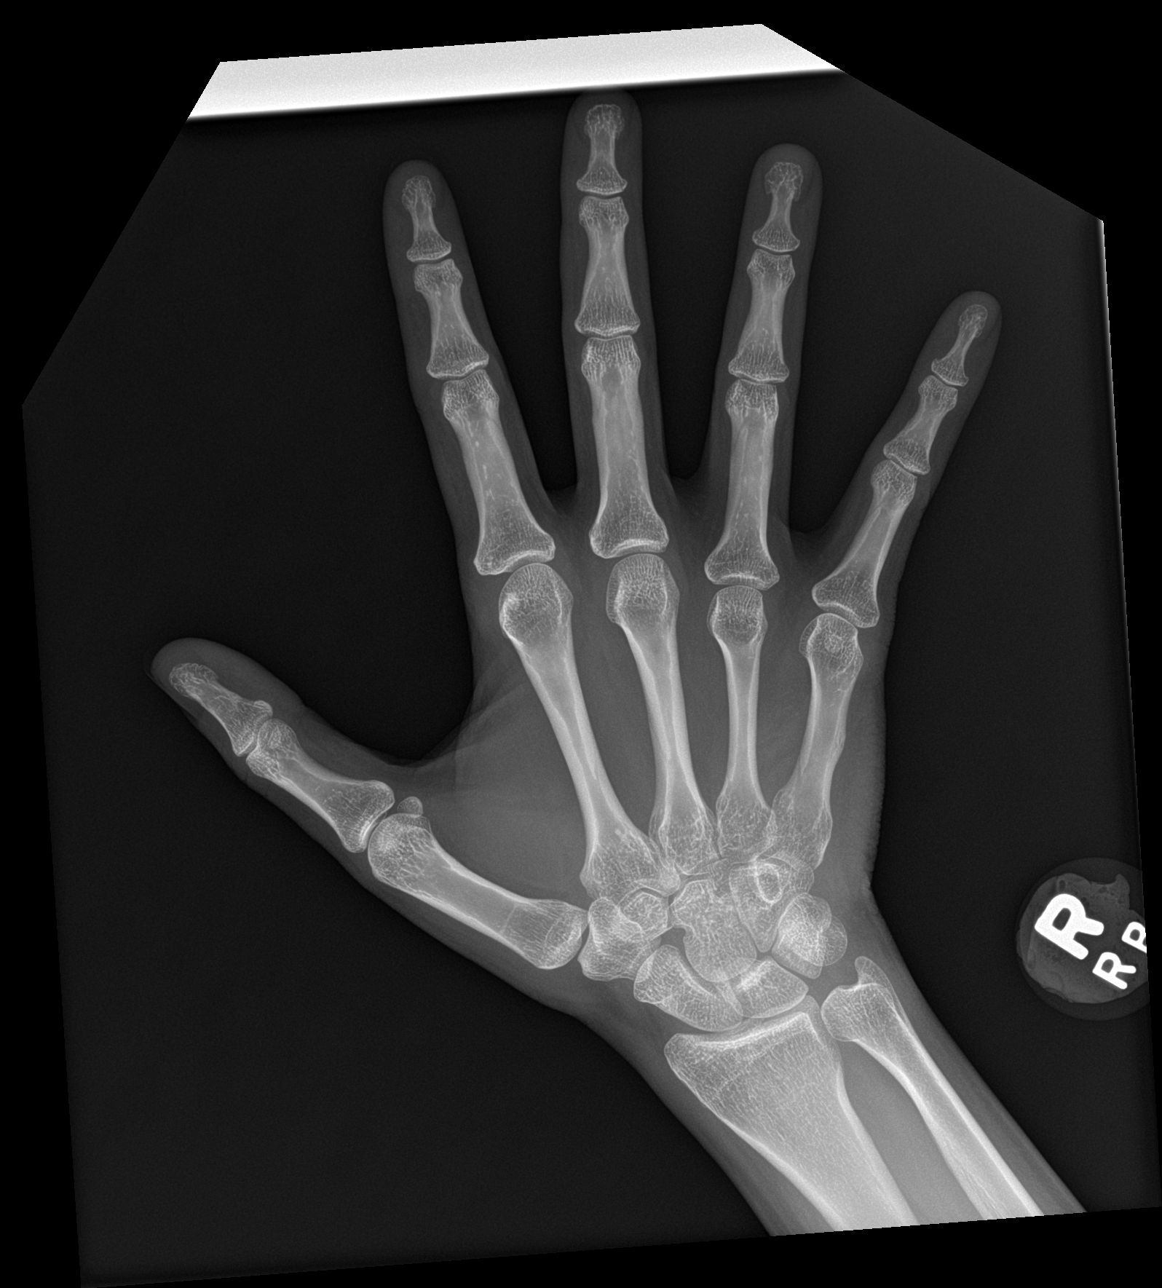

[hand obl]
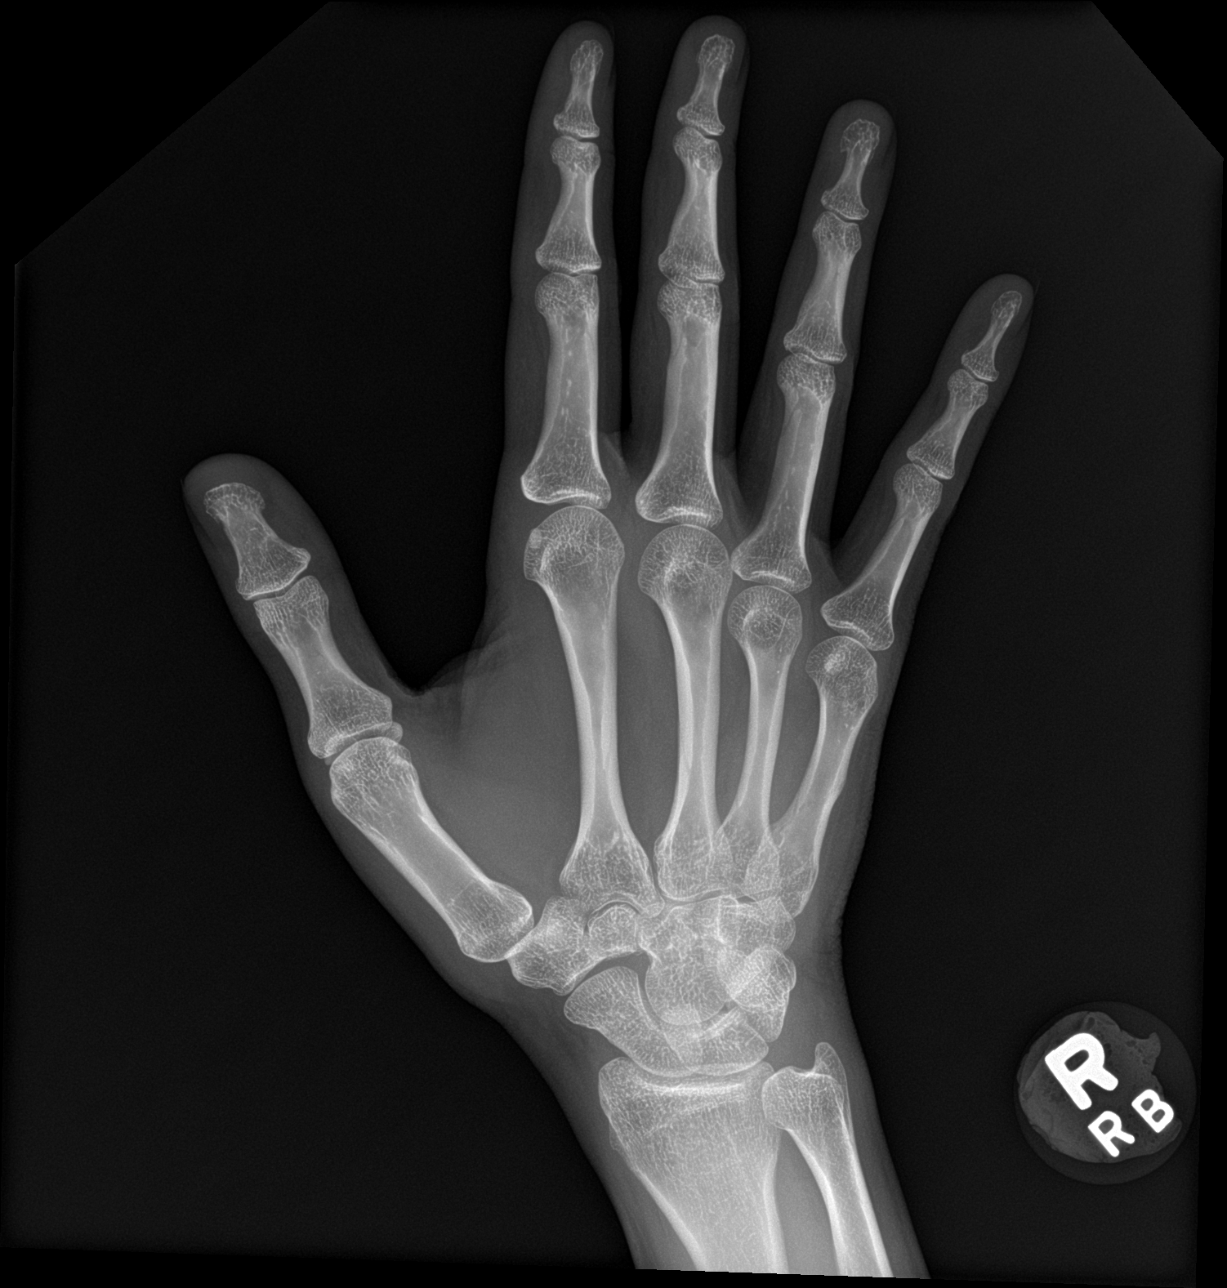

[hand lat]
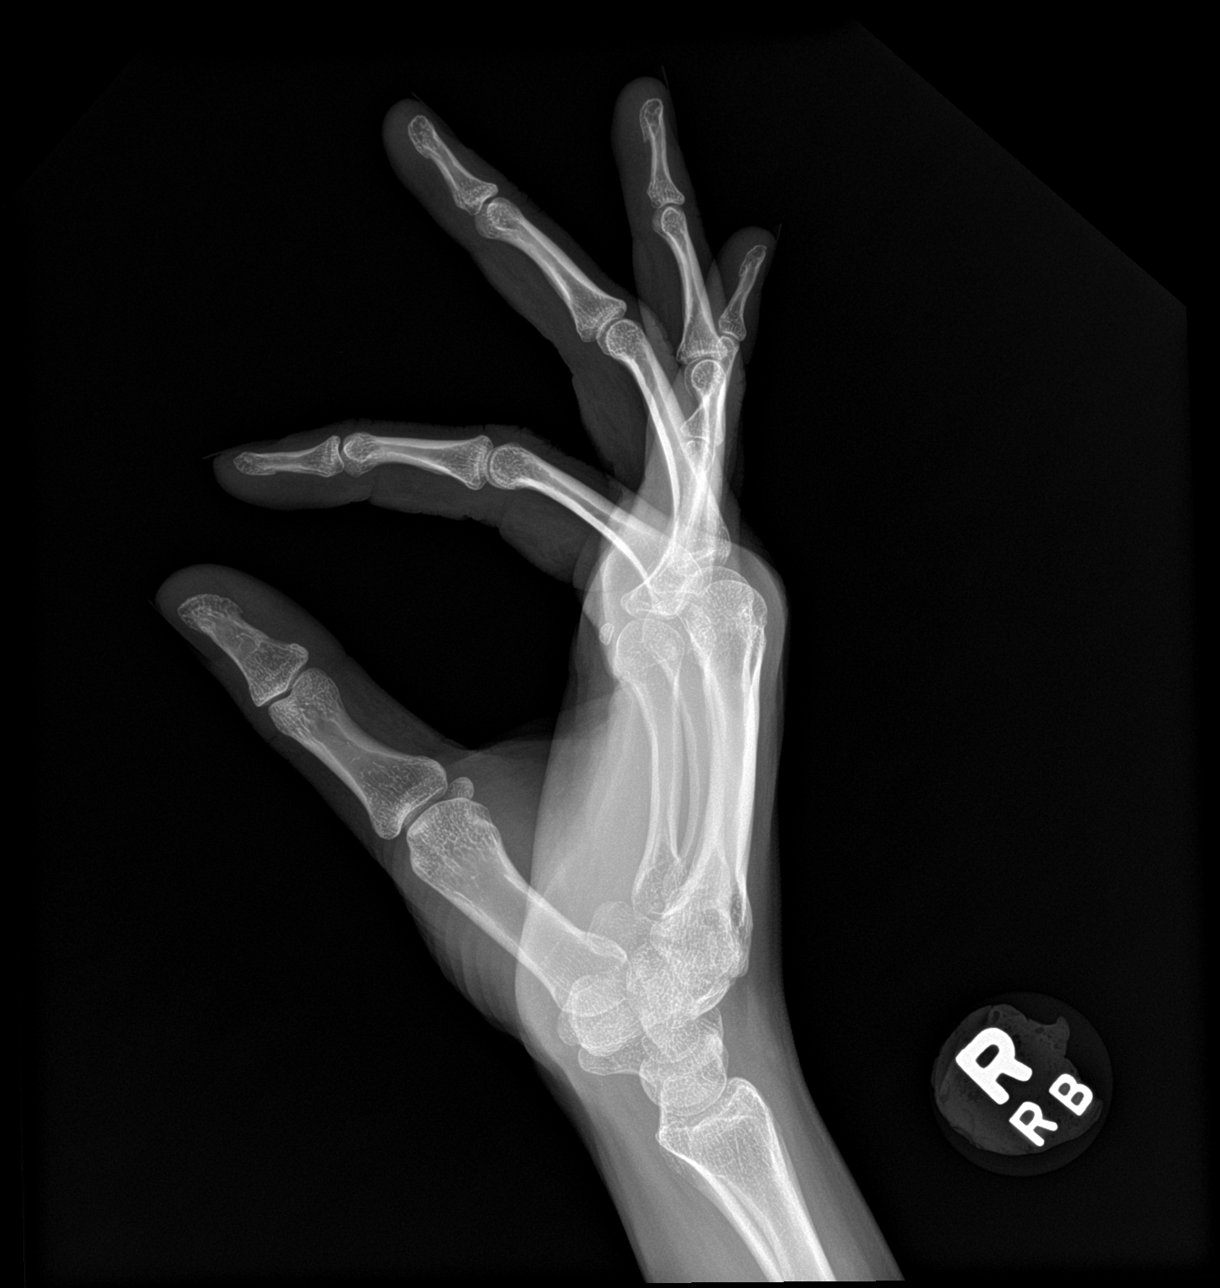

[3 of 3 positions shown; findings below may reference images not displayed]

FINDINGS: There is no evidence of fracture or dislocation. There is no
evidence of arthropathy or other focal bone abnormality. Soft
tissues are unremarkable.
IMPRESSION: Negative.

## 2023-06-30 DIAGNOSIS — F411 Generalized anxiety disorder: Secondary | ICD-10-CM | POA: Diagnosis not present

## 2023-07-14 DIAGNOSIS — F411 Generalized anxiety disorder: Secondary | ICD-10-CM | POA: Diagnosis not present

## 2023-07-21 DIAGNOSIS — F411 Generalized anxiety disorder: Secondary | ICD-10-CM | POA: Diagnosis not present

## 2023-07-28 DIAGNOSIS — F411 Generalized anxiety disorder: Secondary | ICD-10-CM | POA: Diagnosis not present

## 2023-08-04 DIAGNOSIS — F411 Generalized anxiety disorder: Secondary | ICD-10-CM | POA: Diagnosis not present

## 2023-08-25 DIAGNOSIS — F411 Generalized anxiety disorder: Secondary | ICD-10-CM | POA: Diagnosis not present

## 2023-09-08 DIAGNOSIS — F411 Generalized anxiety disorder: Secondary | ICD-10-CM | POA: Diagnosis not present
